# Patient Record
Sex: Female | Born: 1960 | Race: Black or African American | Hispanic: No | Marital: Single | Smoking: Current every day smoker
Health system: Southern US, Community
[De-identification: ages and names within clinical notes are randomized; demographics above are authoritative.]

## PROBLEM LIST (undated history)

## (undated) ENCOUNTER — Ambulatory Visit: Admission: EM | Payer: 59 | Source: Home / Self Care

## (undated) DIAGNOSIS — I10 Essential (primary) hypertension: Secondary | ICD-10-CM

## (undated) DIAGNOSIS — F17200 Nicotine dependence, unspecified, uncomplicated: Secondary | ICD-10-CM

## (undated) DIAGNOSIS — G5600 Carpal tunnel syndrome, unspecified upper limb: Secondary | ICD-10-CM

## (undated) DIAGNOSIS — F1910 Other psychoactive substance abuse, uncomplicated: Secondary | ICD-10-CM

## (undated) HISTORY — PX: ABDOMINAL HYSTERECTOMY: SHX81

## (undated) HISTORY — DX: Essential (primary) hypertension: I10

## (undated) HISTORY — DX: Carpal tunnel syndrome, unspecified upper limb: G56.00

## (undated) HISTORY — DX: Nicotine dependence, unspecified, uncomplicated: F17.200

## (undated) HISTORY — DX: Other psychoactive substance abuse, uncomplicated: F19.10

---

## 1999-07-06 ENCOUNTER — Emergency Department (HOSPITAL_COMMUNITY): Admission: EM | Admit: 1999-07-06 | Discharge: 1999-07-06 | Payer: Self-pay

## 2001-04-10 ENCOUNTER — Emergency Department (HOSPITAL_COMMUNITY): Admission: EM | Admit: 2001-04-10 | Discharge: 2001-04-10 | Payer: Self-pay | Admitting: Emergency Medicine

## 2001-04-10 ENCOUNTER — Encounter: Payer: Self-pay | Admitting: Emergency Medicine

## 2001-05-22 ENCOUNTER — Encounter: Admission: RE | Admit: 2001-05-22 | Discharge: 2001-05-22 | Payer: Self-pay | Admitting: Family Medicine

## 2002-08-26 ENCOUNTER — Emergency Department (HOSPITAL_COMMUNITY): Admission: EM | Admit: 2002-08-26 | Discharge: 2002-08-26 | Payer: Self-pay | Admitting: Emergency Medicine

## 2004-11-02 ENCOUNTER — Emergency Department (HOSPITAL_COMMUNITY): Admission: EM | Admit: 2004-11-02 | Discharge: 2004-11-02 | Payer: Self-pay | Admitting: Emergency Medicine

## 2004-11-10 ENCOUNTER — Ambulatory Visit: Admission: RE | Admit: 2004-11-10 | Discharge: 2004-11-10 | Payer: Self-pay | Admitting: Gynecologic Oncology

## 2004-11-10 ENCOUNTER — Other Ambulatory Visit: Admission: RE | Admit: 2004-11-10 | Discharge: 2004-11-10 | Payer: Self-pay | Admitting: Gynecologic Oncology

## 2004-11-10 ENCOUNTER — Encounter (INDEPENDENT_AMBULATORY_CARE_PROVIDER_SITE_OTHER): Payer: Self-pay | Admitting: *Deleted

## 2004-11-17 ENCOUNTER — Encounter (INDEPENDENT_AMBULATORY_CARE_PROVIDER_SITE_OTHER): Payer: Self-pay | Admitting: *Deleted

## 2004-11-17 ENCOUNTER — Inpatient Hospital Stay (HOSPITAL_COMMUNITY): Admission: RE | Admit: 2004-11-17 | Discharge: 2004-11-20 | Payer: Self-pay | Admitting: Obstetrics & Gynecology

## 2004-11-23 ENCOUNTER — Encounter: Admission: RE | Admit: 2004-11-23 | Discharge: 2004-11-23 | Payer: Self-pay | Admitting: Gynecologic Oncology

## 2004-12-01 ENCOUNTER — Ambulatory Visit (HOSPITAL_COMMUNITY): Admission: RE | Admit: 2004-12-01 | Discharge: 2004-12-01 | Payer: Self-pay | Admitting: Gynecology

## 2004-12-29 ENCOUNTER — Ambulatory Visit: Admission: RE | Admit: 2004-12-29 | Discharge: 2004-12-29 | Payer: Self-pay | Admitting: Gynecologic Oncology

## 2005-01-22 ENCOUNTER — Ambulatory Visit: Payer: Self-pay | Admitting: Family Medicine

## 2005-02-22 ENCOUNTER — Ambulatory Visit: Payer: Self-pay | Admitting: Sports Medicine

## 2005-12-06 IMAGING — CR DG CHEST 2V
2 series · 2 of 2 positions shown · non-contrast
Comparison: None.

CLINICAL DATA: Pelvic mass.  Preop respiratory exam.  
 CHEST - 2 VIEW:

[view not recorded (1 of 2)]
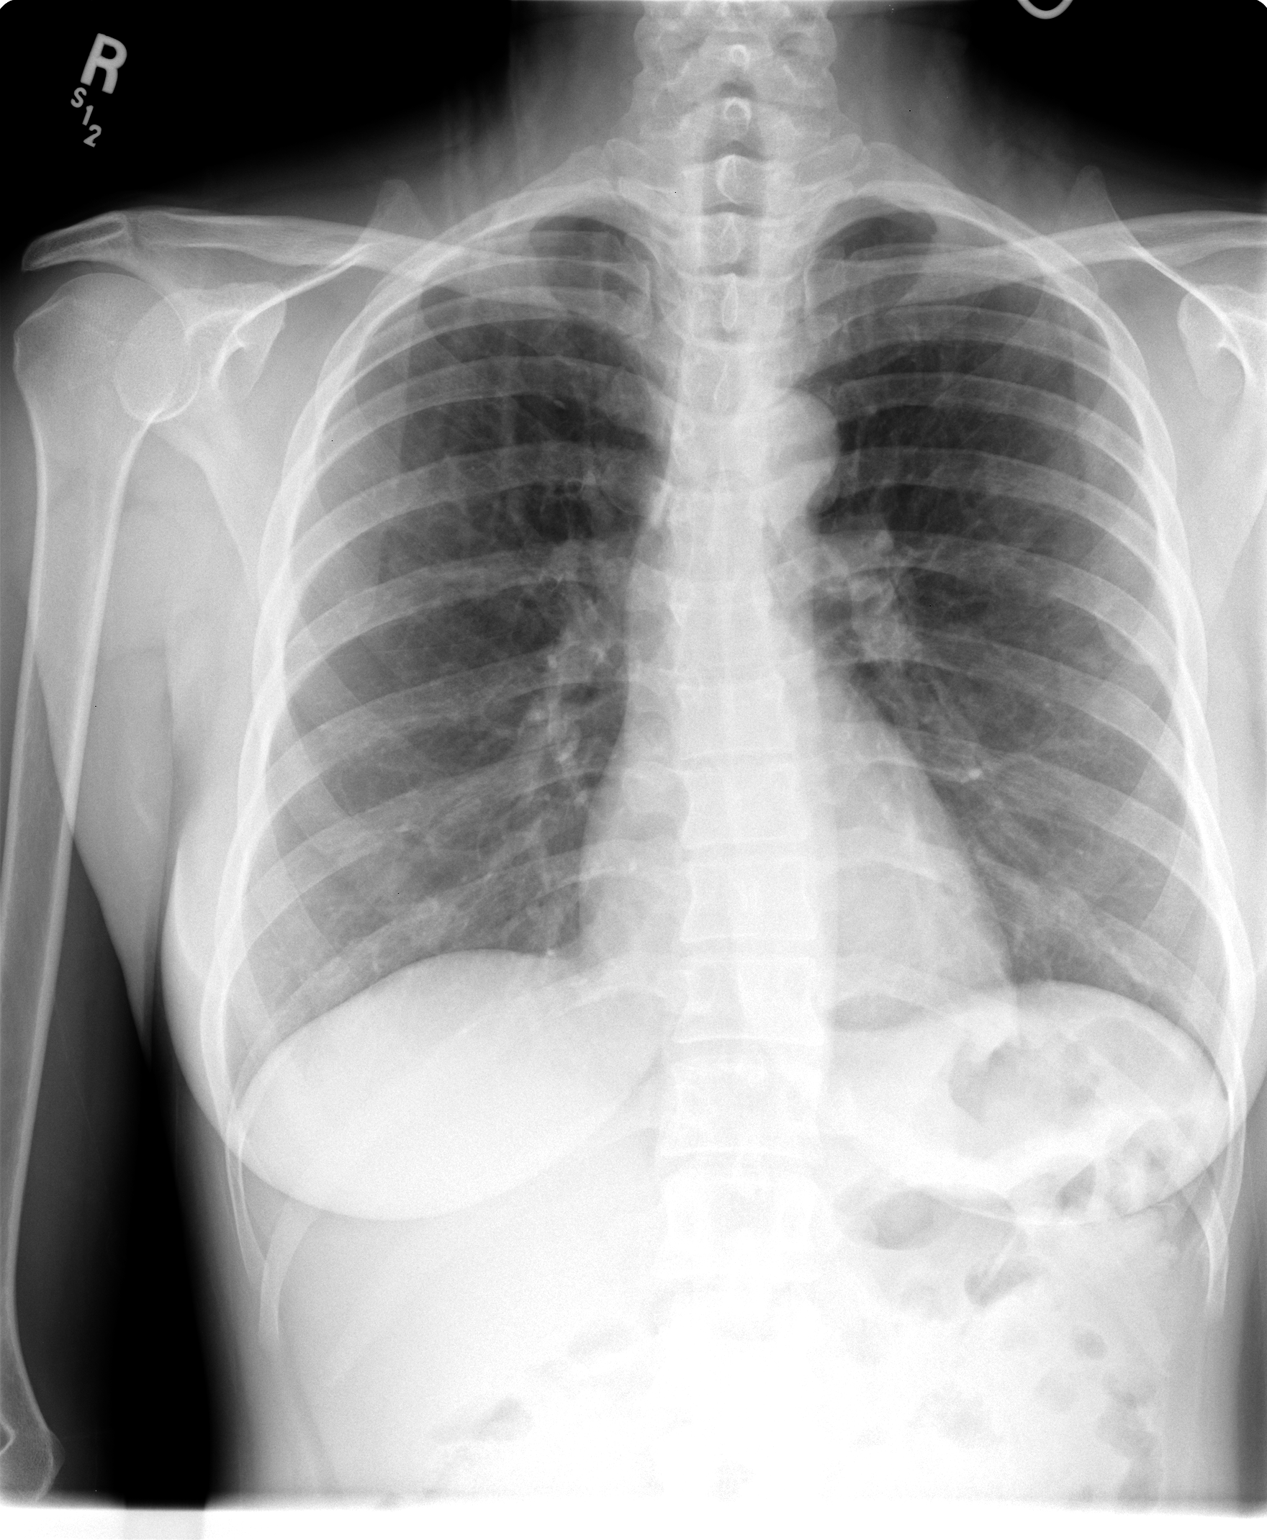

[view not recorded (2 of 2)]
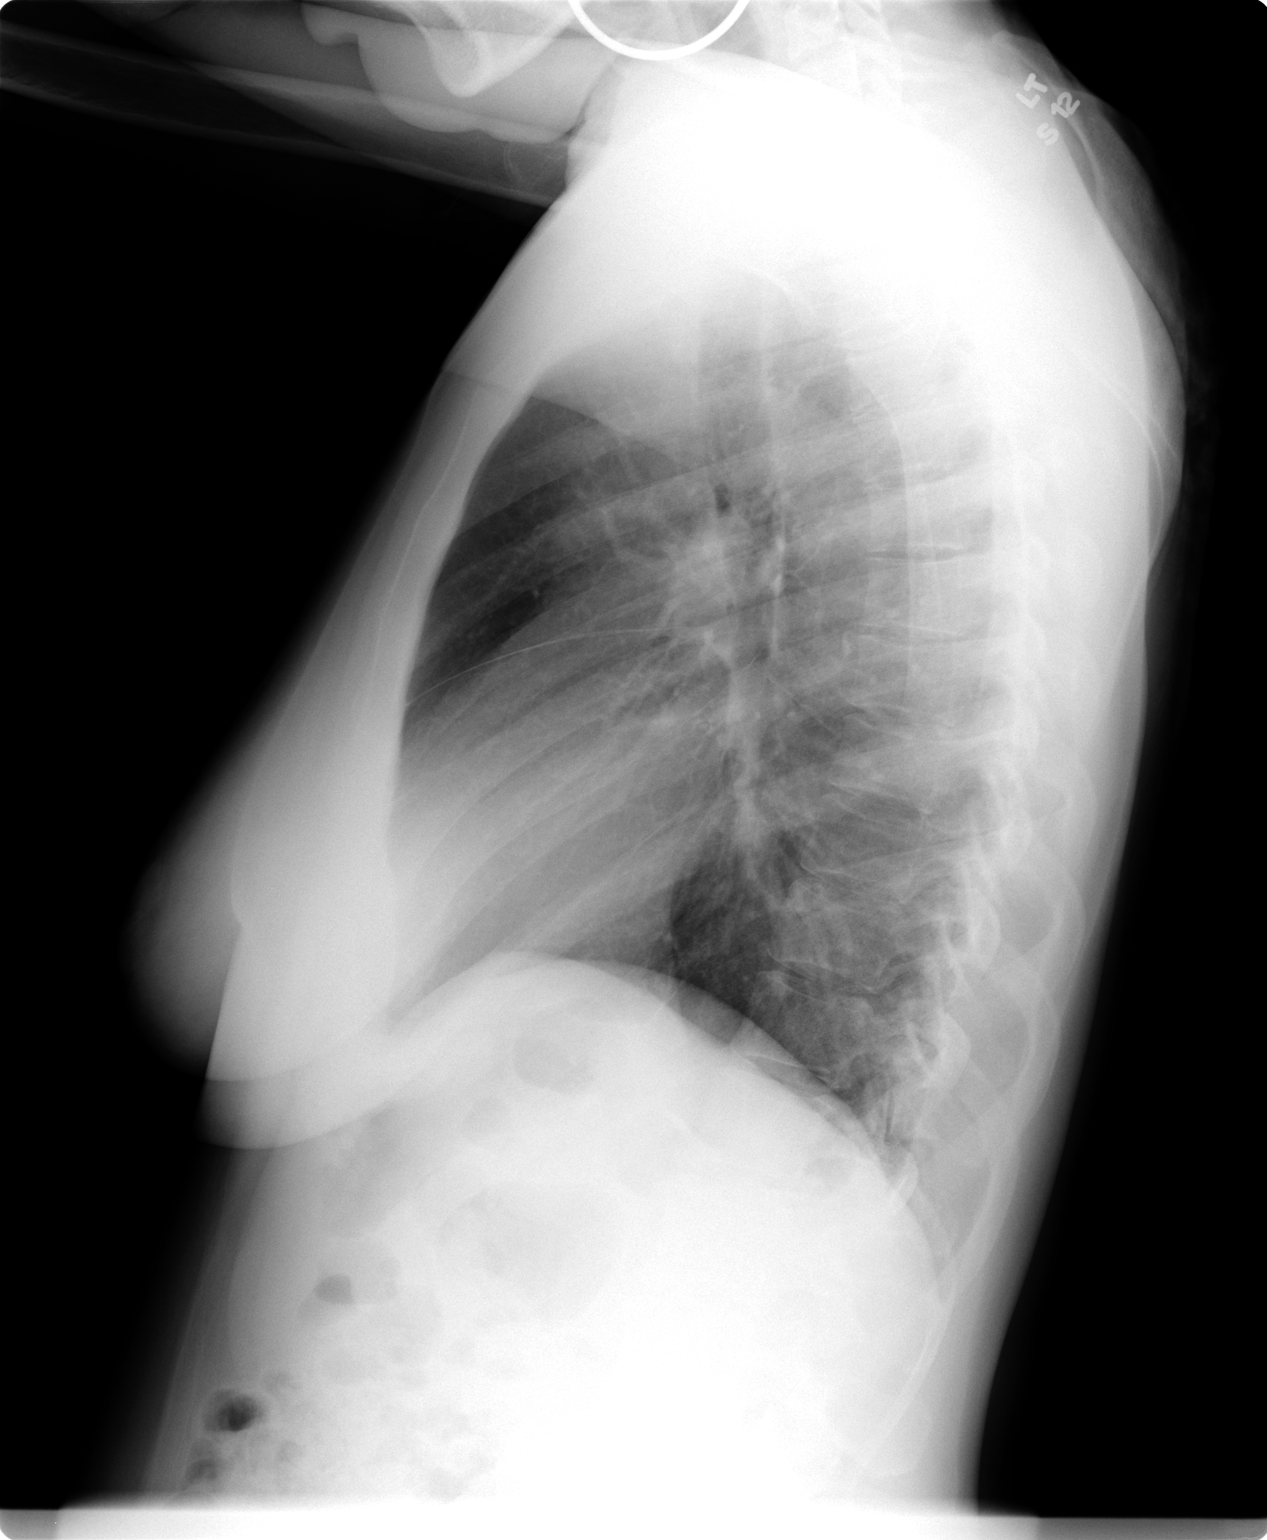

[2 of 2 positions shown; findings below may reference images not displayed]

Heart size and mediastinal contours are normal.  There is no evidence of pulmonary infiltrate or pleural effusion.
 A subtle asymmetric nodular opacity is seen in the left mid lung at the level of the posterior seventh intercostal space.  This measures approximately 1 cm, and a pulmonary nodule cannot be excluded.  
 No other nodules or masses are seen and there is no evidence of pleural effusion.  There is no evidence of pulmonary infiltrate.
IMPRESSION: 1.  No acute infiltrates.
 2.  Asymmetric pulmonary nodular opacity in the left mid lung.  Chest CT is recommended for further evaluation.

## 2005-12-11 IMAGING — CR DG CHEST 2V
2 series · 2 of 2 positions shown · non-contrast
Comparison: 11/13/04.

CLINICAL DATA: Pelvic mass with hysterectomy two days ago.  Weakness, fever and dyspnea.
 CHEST - 2 VIEW:

[w chest pa]
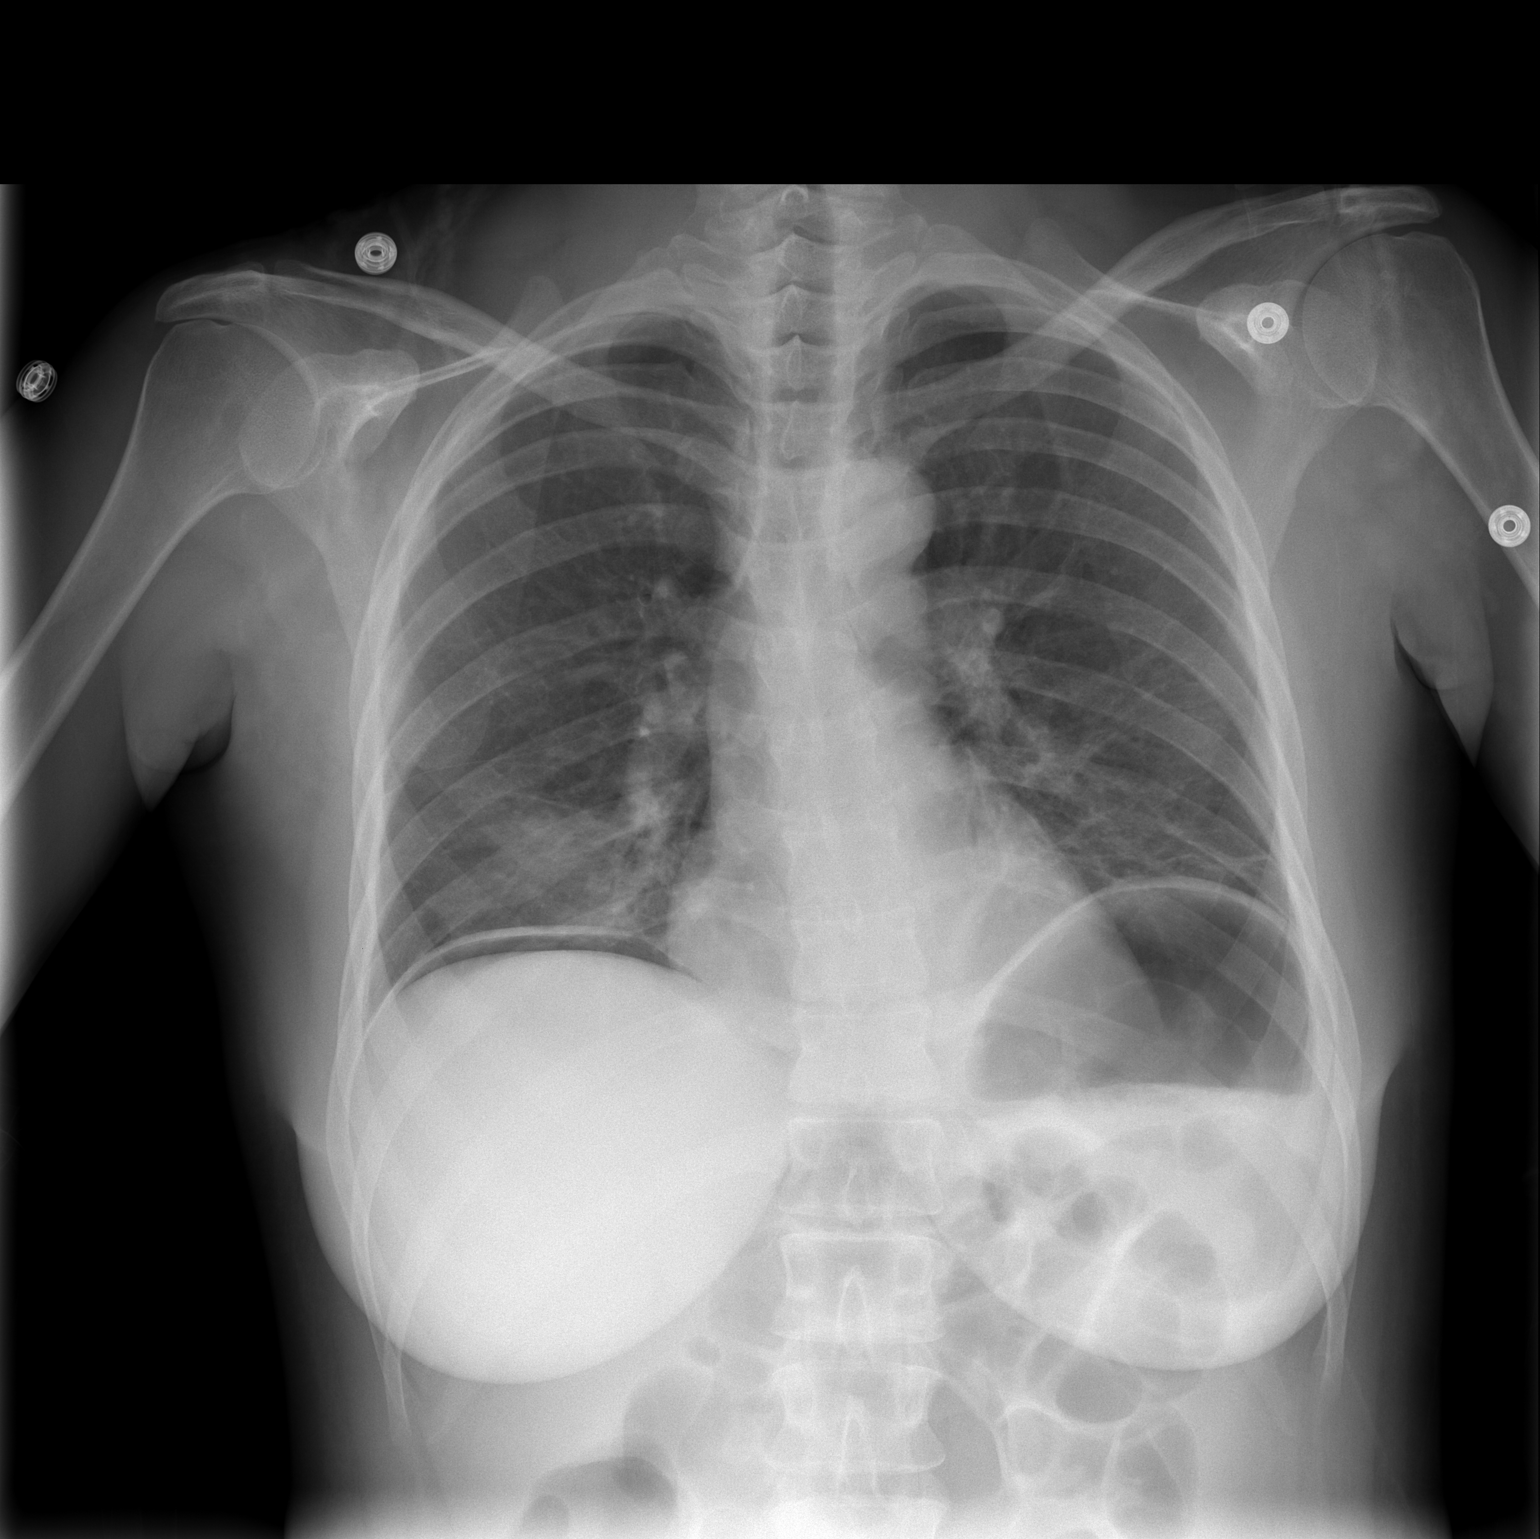

[w chest lat]
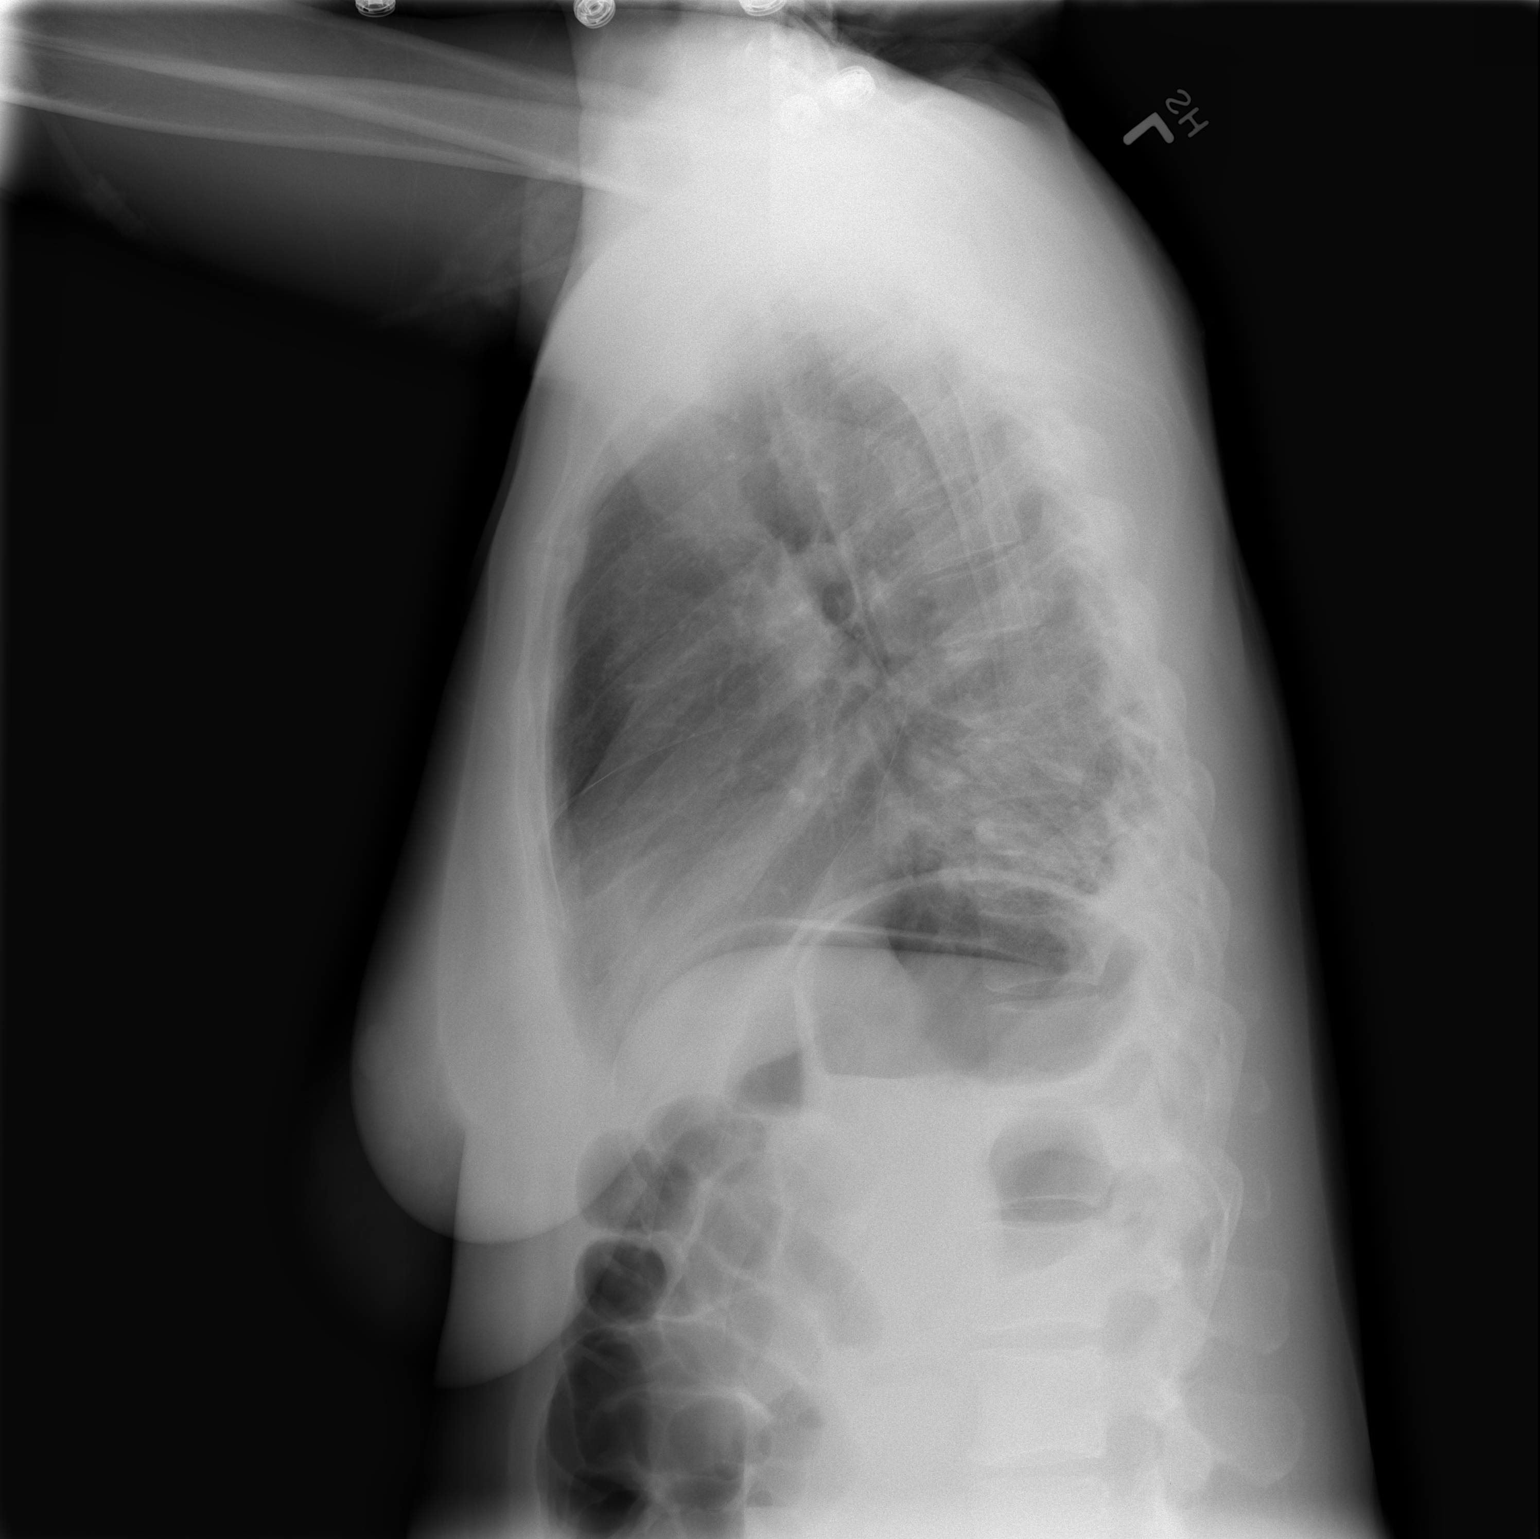

[2 of 2 positions shown; findings below may reference images not displayed]

New bilateral lower lung atelectasis /air space disease noted.  Pneumonia not excluded.  Small amount of pneumoperitoneum is likely postoperative; correlate clinically.  There may be mild ileus in the upper abdomen.
IMPRESSION: 1.  Bilateral lower lung atelectasis/air space disease.  
 2.  Pneumoperitoneum, likely postoperative, but correlate clinically.
 3.  Mild ileus.

## 2011-01-22 NOTE — Consult Note (Signed)
NAMELIEN, LYMAN           ACCOUNT NO.:  [id number]   MEDICAL RECORD NO.:  [id number]          PATIENT TYPE:  OUT   LOCATION:  GYN                          FACILITY:  [facility]   PHYSICIAN:  Paola A. Duard Brady, MD    DATE OF BIRTH:  [birth year]   DATE OF CONSULTATION:  12/29/2004  DATE OF DISCHARGE:                                   CONSULTATION   REASON FOR CONSULTATION:  Ms. Heavrin is a very pleasant 50 year old who  is referred to [place] secondary to a large abdominal pelvic mass. Tumor markers  were unremarkable. She subsequently on November 17, 2004 underwent exploratory  laparotomy, TAH/BSO. 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  There was no significant mitosis, no cellular atypia, no necrosis. Tubes and  ovaries were negative. As part of her preoperative workup, she also had  screening mammogram that showed scattered subcentimeter nonpalpable cysts,  most likely fibroadenomas. She had ultrasound and diagnosed mammography  performed November 23, 2004 and recommendations were for 18-month follow-up with  breast imaging consisting of a breast ultrasound. She also had a CT scan of  the chest in March secondary to left upper lobe nodular density noticed on  presurgical screening chest x-ray and it revealed no evidence of a left  upper lobe nodule that corresponded with the chest x-ray abnormality. It did  reveal a 2-3 mm nodule on the right middle lobe that appeared benign and no  further workup was recommended. She comes in today for her postoperative  follow-up. She is overall doing quite well and denies any complaints  whatsoever. She states her energy level is good. She is sleeping a lot and  eating a lot, but that is because she states she is bored. She denies any  vaginal bleeding or discharge. She has had normal return of bowel and  bladder function. She has minimal abdominal pain, only experiences a small  amount of discomfort when she wore jeans that were a little  tight along the  length of the incision. She otherwise feels that she will be ready to go  back to work next week.   PHYSICAL EXAMINATION:  VITAL SIGNS:  Blood pressure 138/92, weight 125  pounds.  GENERAL:  Well-nourished, well-developed female in no acute distress.  ABDOMEN:  Shows a well-healed vertical skin incision. Sutures from the #1  PDS are palpable. Abdomen is soft and nontender. There is no evidence of an  incisional hernia.  PELVIC:  External genitalia is within normal limits. The vagina is normal.  The vaginal cuff is healing well, has not completely healed, sutures are  still visible. Bimanual examination:  The vaginal cuff is nontender. There  is no fullness or nodularity palpated.   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   PLAN:  1.  She is released to work effective Jan 06, 2005. She will contact [employer] which is the insurance company for her employer and let      them know that  she will return back to work.  2.  I have asked her to wait another 2 weeks with regard to pelvic activity      and intercourse.  3.  She knows that this was not a malignant process. She will return to see      [place] on a p.r.n. basis but we will release her from our service.  4.  She will take the initiative herself to get set up for follow-up breast      imaging in September.  5.  She would like a primary care physician and would like to be seen at      [facility]. She has been made an appointment for Jan 05, 2005.      PAG/MEDQ  D:  12/29/2004  T:  12/29/2004  Job:  [id number]   cc:   Excell Seltzer. Annabell Howells, M.D.  [address]  [place]  [place] [address]  Fax: [phone]   Telford Nab, R.N.  [phone]  [place], [place] [address]   Roseanna Rainbow, M.D.

## 2011-01-22 NOTE — Consult Note (Signed)
Lauren Lester, Lauren Lester           ACCOUNT NO.:  [id number]   MEDICAL RECORD NO.:  [id number]          PATIENT TYPE:  OUT   LOCATION:  GYN                          [facility]ACILITY:  [facility]   PHYSICIAN:  Paola A. Duard Brady, MD    DATE OF BIRTH:  04/08/1961   DATE OF CONSULTATION:  11/10/2004  DATE OF DISCHARGE:                                   CONSULTATION   REFERRING PHYSICIAN:  Excell Seltzer. Annabell Howells, M.D.   Patient is seen today at the request of Dr. Annabell Howells.   Lauren Lester is a very pleasant 50 year old gravida 1, para 1, who was  evaluated acutely in the emergency room on November 04, 2004 with urinary  retention.  She subsequently underwent a CT scan of the abdomen and pelvis  at that time, which revealed bilobular soft tissue mass with irregular  enhancement within the pelvis.  The inferior pelvic portion of the mass  measured 12 x 12 cm, and there was a lobular solid component that extended  superiorly into the right, measuring 10 x 5.6 cm.  There was lateral  deviation of the ureters and compression of the ureters at the pelvis brim.  There was a 2.8 cm cyst adjacent to the left iliac crest, which could be the  left ovary.  There was not any definitive right ovary noted.  It is also  felt that this most likely represented a fibroid uterus but could not rule  out malignancy.  There was no ascites or obvious lymphadenopathy.  This was  the reason that she was referred to [place] today.  She has had limited access to  GYN care, and this is the first that she has known of fibroids.  Her last  GYN exam was anywhere from 5 to 10 years ago.  She is having regular cycles.  Her last cycle was October 20, 2004.  She denies any irregular bleeding,  intramenstrual spotting, or postcoital bleeding.  She has noted an increase  in cramps over the past several years but is not really particularly  bothered.  She denies any symptoms consistent with pelvic pressure or change  in her bowel or bladder habits other than  this acute urinary retention.  She  currently has an indwelling Foley catheter.  She does use stool softeners,  but that has been a longstanding issue for her.  She is sexually active and  denies any postcoital bleeding or dyspareunia.   PAST MEDICAL HISTORY:  None.   PAST SURGICAL HISTORY:  Left leg surgery.  Normal spontaneous vaginal  delivery.   MEDICATIONS:  Stool softener.   ALLERGIES:  None.   SOCIAL HISTORY:  She smokes a half pack per day.  She has done so for 10  years.  She drinks alcohol socially.  She denies the use of any recreational  drugs.  She is married.  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   FAMILY HISTORY:  Her father died of prostate cancer last year.  Her mother  has hypertension.   HEALTH MAINTENANCE:  She has never had a mammogram.   PHYSICAL EXAMINATION:  VITAL SIGNS:  Height 5 foot 8.  Weight 128 pounds.  Blood pressure 148/103.  Repeat 140/90.  Pulse 92.  Respirations 18.  GENERAL:  A well-developed and well-nourished female in no acute distress.  NECK:  Supple.  There is no lymphadenopathy.  No thyromegaly.  LUNGS:  Clear to auscultation bilaterally.  CARDIOVASCULAR:  Regular rate and rhythm.  BREASTS:  Symmetrical.  On the right breast, there is a 1 x 1.5 cm ridge of  tissue, which most likely represents fibrocystic changes.  There is no  overlying skin changes, and it is nontender.  The patient is allowed to  palpate the mass today and felt that she has not noticed that before.  There  is no nipple discharge or axillary adenopathy.  The right breast has no  masses or nodularity, or nipple discharge or axillary adenopathy.  ABDOMEN:  She has a palpable abdominal pelvic mass below the level of the  umbilicus.  Abdomen is soft, nontender and nondistended.  There are no  palpable masses or hepatosplenomegaly.  Groins are negative for adenopathy.  EXTREMITIES:  There is no edema.  PELVIC:  External genitalia is within normal limits.  She  does have an  indwelling Foley catheter.  The vagina is well epithelialized.  The cervix  is somewhat flattened and deviated anteriorly.  There is physiologic  discharge.  There are no visible lesions.  ThinPrep Pap smear was  assimilated without difficulty.  Bimanual examination reveals a large  abdominopelvic mass measuring approximately 15 cm.  I cannot separate the  adnexa particularly.  Rectovaginal examination confirms the mass is mobile.  One can elevate it out of the pelvis.  The rectovaginal mucosa is smooth.   ASSESSMENT:  A 50 year old with urinary retention, most likely secondary to  massive uterine fibroids; however, malignancy cannot be excluded.  She is  tentatively scheduled for surgery for November 17, 2004 with myself and Dr.  Antionette Char.  The patient wishes to have a total abdominal  hysterectomy, bilateral salpingo-oophorectomy.  She does know there is a  possibility of malignancy.  The risks of surgery, including but not limited  to, bleeding, infection, injury to surrounding organs, ureteral injury,  bowel injury, nerve damage, and venothrombolic disease are discussed with  the patient.  She is anemic with a hematocrit of 28.  The possibility of a  blood transfusion was also discussed with her, and she agreed and will  proceed with one should the need arise.   Her questions were answered.  She knows she will be contacted with regards  to her preoperative workup and was given our cards to call me should she  have any questions.      PAG/MEDQ  D:  11/10/2004  T:  11/10/2004  Job:  [id number]   cc:   Telford Nab, R.N.  [address]  [place], [place] [address]   Excell Seltzer. Annabell Howells, M.D.  [address]  [place]  [place] [address]  Fax: [phone]   Roseanna Rainbow, M.D.

## 2011-01-22 NOTE — Op Note (Signed)
Lauren Lester, Lauren Lester           ACCOUNT NO.:  [id number]   MEDICAL RECORD NO.:  [id number]          PATIENT TYPE:  INP   LOCATION:  0008                         FACILITY:  [facility]   PHYSICIAN:  Paola A. Duard Brady, MD    DATE OF BIRTH:  1961/06/19   DATE OF PROCEDURE:  11/17/2004  DATE OF DISCHARGE:                                 OPERATIVE REPORT   PREOPERATIVE DIAGNOSES:  1.  Probable uterine fibroids.  2.  Urinary retention.   POSTOPERATIVE DIAGNOSES:  1.  Probable uterine fibroids.  2.  Urinary retention.   PROCEDURE:  Exploratory laparotomy, TAH, BSO.   SURGEONS:  1.  Dr. Duard Brady.  2.  Dr. Antionette Char.   ASSISTANT:  Telford Nab, R.N.   ANESTHESIA:  General.   SPECIMENS:  Cervix, uterus, fibroids, tubes, and ovaries.   ESTIMATED BLOOD LOSS:  975 mL.   URINE OUTPUT:  75 mL IV fluid, 4000 mL.   FINDINGS:  Findings at time of surgery included a uterus with multiple small  uterine fibroids measuring approximately 8 cm.  Then off the inferior-  posterior aspect of the uterus near the cervix, there was a large 16 cm  uterine myoma encompassing the entire posterior cul-de-sac.  Tubes and  ovaries were unremarkable.  She had a small 2 cm left ovarian cyst.   The patient was taken to the operating room after informed consent was  reviewed.  She was placed in the supine position, and general anesthesia was  induced.  She was then placed in the dorsal lithotomy position with all  appropriate precautions being taken.  Time out was performed.  Abdomen was  prepped and draped in the usual sterile fashion.  Perineum was prepped in  the usual fashion.  Her prior Foley catheter was removed, and a sterile  Foley was placed under sterile conditions.  A vertical infraumbilical  midline incision was made with a knife and carried down to the fascia  sharply.  The fascia was identified, and the fascia was opened and scored in  the midline and opened inferior and superiorly with  Bovie cautery.  The  peritoneum was identified, tented, and entered sharply.  Peritoneal incision  was extended using Bovie cautery, with front visualization of just the  underlying peritoneal cavity.  At this point, the findings of the above were  noted.  Our attention was first drawn to the round ligament on the patient's  left side.  It was cauterized, and the anterior and posterior leafs of the  broad ligament were opened.  The pelvic vessels were identified; the ureter  was palpated, and a window was created in the peritoneum.  The IP was  clamped x 2, transected, and ligated with 0 Vicryl.  Our attention was then  drawn to the anterior leaf of the broad ligament, and the bladder flap was  created.  There were significant dense adhesions of the bladder to the  anterior portion of the cervix and this large myoma.  There was  some__________on the patient's right side.  The round ligament was  transected, and the anterior and posterior leafs of the  broad ligament were  opened using Bovie cautery.  The ureter was identified, and a window was  created the infundibulopelvic vessels and the ureter.  The IP was clamped  three times, transected, and ligated with 0 Vicryl.  An attempt was made to  mobilize and deliver the mass out of the cul-de-sac; however, it could not  be elevated.  At this time, there was bleeding noted from the uterine artery  on the patient's right side.  We skeletonized the uterine artery at the  level of the uterus isthmic junction; however, this large mass was  distorting the anatomy significantly.  It was clamped with curved Heaneys,  transected, and suture ligated.  There continued to be a small amount of  backbleeding.  The bladder flap was then created sharply.  The posterior  peritoneum was opened with Bovie cautery.  This was carried from the  patient's right side around to the patient's left side.  The uterine cautery  was identified on her left side, clamped x  2, transected, and ligated.  There was no significant bleeding from any one source but more of a diffuse  ooze of all the peritoneal surfaces at this point.  The decision was made to  continue mobilizing the peritoneum posteriorly which we did.  The bladder  flap was created __________was noted to be well below the level of the  cervix.  Again, there were dense adhesions of the bladder to the anterior  cervix, and bladder flap was created using sharp dissection with Bovie  cautery for hemostasis as indicated.  We had not been able to visually see  the ureters at this point.  The decision was then made to open the uterus  and deliver the myoma out of the cul-de-sac to allow with visualization.   The uterus was scored.  The myoma was grasped and delivered out of the  pelvis.  The uterine remnant was then grasped with Allis clamps.  At this  point, we were able to visualize the patient's pelvic sidewall on the left  side better as the myoma was delivered out of the pelvis.  We were able to  bring down and dissect the uterine artery free of the uterine serosa and at  a point at the level of the cervix, we double clamped the uterine artery,  transected it, and suture ligated it with 0 Vicryl.  Straight Heaney clamps  were then used down the cardinal ligaments to clamp the uterosacral.  It was  clamped, cut with a knife, and suture ligated with 0 Vicryl.  At this point,  we were at the cervicovaginal angle on the patient's left side and, again,  it was clamped with Heaney, transected, and suture ligated with a figure-of-  eight suture of 0 Vicryl.  We then proceeded to perform a similar procedure  on the patient's right side.  We were able to take the uterine artery lower  in the pelvis.  After delivering the myoma, the uterine artery was doubly  clamped, transected, and  suture ligated.  It was difficult to determine the level of the cervicovaginal angle on the patient's right side.   Therefore,  the surgeon's hand was placed in the vagina, and Bovie cautery was used on  the surgeon's finger around the cervix to amputate the specimen.  The  vaginal mucosa was then grasped with Allis clamps.  The specimen was  delivered and sent to pathology for frozen section.   The vaginal cuff was closed  using figure-of-eight sutures of 0 Vicryl.  After this procedure was completed, there was noted to be a small area of  bleeding at the level of the uterine artery on the patient's right side.  The ureter was identified along the medial leaf of the broad ligament.  It  was noted to be nondilated and peristalsing.  A hemoclip was placed lateral  to the ureter at the level of the uterine artery where there was a small  line of bleeding.  The peritoneal edges also bleeding somewhat.  Cautery was  used for hemostasis.   Our attention was then turned to the patient's left pelvic sidewall.  The  retroperitoneum was opened in its entirety.  The ureter was identified.  It  was noted to be nondilated.  There was a small amount of bleeding of the  vaginal cuff.  This was remedied with a 2-0 Vicryl figure-of-eight suture  involving the posterior peritoneum on the patient's left side.  The abdomen  and pelvis were copiously irrigated.  Gelfoam was then placed in the pelvis.  A laparotomy sponge was removed from the abdomen.  The omentum was palpably  normal.  There was no lymphadenopathy  palpated.  The Bookwalter retractor was then removed.  The fascia was closed  using a running #1 PDS.  The subcu tissues were irrigated, and the skin was  closed using skin clips.  The patient tolerated the procedure well and was  taken to the recovery room in stable condition.  All instrument and  laparotomy counts were correct x 2.      PAG/MEDQ  D:  11/17/2004  T:  11/17/2004  Job:  [id number]   cc:   Roseanna Rainbow, M.D.   Telford Nab, R.N.  [address]  [place], [place] [address]   Excell Seltzer. Annabell Howells, M.D.  [address]  [place]  [place] [address]  Fax: [phone]

## 2011-01-22 NOTE — Discharge Summary (Signed)
NAMEWILLIEMAE, Lauren Lester           ACCOUNT NO.:  [id number]   MEDICAL RECORD NO.:  [id number]          PATIENT TYPE:  INP   LOCATION:  0442                         FACILITY[facility]  [facility]   PHYSICIAN:  Roseanna Rainbow, M.D.DATE OF BIRTH:  Aug 29, 1961   DATE OF ADMISSION:  11/17/2004  DATE OF DISCHARGE:  11/20/2004                                 DISCHARGE SUMMARY   CHIEF COMPLAINT:  The patient is a 50 year old para 1 with large uterine  fibroids who presents for surgical intervention. Please see the dictated  History and Physical as per Dr. Cleda Mccreedy for further details.   HOSPITAL COURSE:  The patient was admitted and underwent an exploratory  laparotomy with total abdominal hysterectomy and bilateral salpingo-  oophorectomy. Again, please see the dictated operative summary for further  details. Her postoperative course was remarkable for a febrile episode with  fever to 101 within the first 24 hours. Chest x-ray demonstrated  atelectasis. Urinalysis was remarkable for pyuria and she was started on  antibiotics. She was discharged to home on postoperative day #4 tolerating a  regular diet.   DISCHARGE DIAGNOSES:  1.  Uterine fibroids.  2.  Urinary tract infection.   PROCEDURE:  Total abdominal hysterectomy and bilateral salpingo-  oophorectomy.   MEDICATIONS:  Vicodin, Levaquin, and ibuprofen.   ACTIVITY:  Progressive activity. No lifting or straining for 6 weeks. Pelvic  rest.   DISPOSITION:  The patient was to follow up at the breast center for a  mammogram on November 23, 2004 and to follow up in the GYN oncology on March 20  for staple removal.      LAJ/MEDQ  D:  12/15/2004  T:  12/15/2004  Job:  [id number]   cc:   Telford Nab, R.N.  [address]  [place], [place] [address]   Excell Seltzer. Annabell Howells, M.D.  [address]  [place]  [place] [address]  Fax: [phone]

## 2012-12-02 ENCOUNTER — Ambulatory Visit (INDEPENDENT_AMBULATORY_CARE_PROVIDER_SITE_OTHER): Payer: Managed Care, Other (non HMO) | Admitting: Family Medicine

## 2012-12-02 VITALS — BP 142/90 | HR 87 | Temp 98.3°F | Resp 18 | Ht 65.0 in | Wt 147.0 lb

## 2012-12-02 DIAGNOSIS — B029 Zoster without complications: Secondary | ICD-10-CM

## 2012-12-02 DIAGNOSIS — F1021 Alcohol dependence, in remission: Secondary | ICD-10-CM | POA: Insufficient documentation

## 2012-12-02 MED ORDER — VALACYCLOVIR HCL 1 G PO TABS: 1000.0000 mg | ORAL_TABLET | Freq: Three times a day (TID) | ORAL | Status: DC

## 2012-12-02 MED ORDER — PREDNISONE 20 MG PO TABS: ORAL_TABLET | ORAL | Status: DC

## 2012-12-02 MED ORDER — GABAPENTIN 300 MG PO CAPS: 300.0000 mg | ORAL_CAPSULE | Freq: Three times a day (TID) | ORAL | Status: DC

## 2012-12-02 NOTE — Progress Notes (Signed)
[address]   [place], [place]  [address]   [phone]  Subjective:    Patient ID: Lauren Lester, female    DOB: 1961/09/06, 52 y.o.   MRN: [id number]  HPI This 52 y.o. female presents for evaluation of R sided torso rash.  Onset four days ago; started with R upper back rash; has extended to R breast.  Working eleven hours per shift; lots of work stressors.  +chicken pox.  No fever but mild HA.  Taking Ibuprofen with relief.  +itching and burning.  Really sore; throbs.  Intermittent numbness and tingling.   Husband with CHF with recent defibrillator placement.  [facility]P:  [facility]   Review of Systems  Constitutional: Negative for fever, chills, diaphoresis, activity change, appetite change and fatigue.  Skin: Positive for color change and rash. Negative for wound.  Neurological: Positive for headaches. Negative for light-headedness and numbness.        Past Medical History  Diagnosis Date  . Substance abuse     Past Surgical History  Procedure Laterality Date  . Abdominal hysterectomy      Prior to Admission medications   Medication Sig Start Date End Date Taking? Authorizing Provider  gabapentin (NEURONTIN) 300 MG capsule Take 1 capsule (300 mg total) by mouth 3 (three) times daily. 12/02/12   Ethelda Chick, MD  predniSONE (DELTASONE) 20 MG tablet Three tablets daily x 1 day then two tablets daily x 5 days then one tablet daily x 5 days 12/02/12   Ethelda Chick, MD  valACYclovir (VALTREX) 1000 MG tablet Take 1 tablet (1,000 mg total) by mouth 3 (three) times daily. 12/02/12   Ethelda Chick, MD    No Known Allergies  History   Social History  . Marital Status: Single    Spouse Name: N/A    Number of Children: N/A  . Years of Education: N/A   Occupational History  . Not on file.   Social History Main Topics  . Smoking status: Current Every Day Smoker  . Smokeless tobacco: Not on file  . Alcohol Use: No  . Drug Use: No  . Sexually Active: Yes     Other Topics Concern  . Not on file   Social History Narrative   Marital status: married      Children: one child; no grandchild.      Lives: with husband.      Employment: Location manager.      Tobacco: 1 ppd      Alcohol: none; previous alcoholism; cessation since 2013.      Drugs:  none    Family History  Problem Relation Age of Onset  . Hypertension Mother     Objective:   Physical Exam  Nursing note and vitals reviewed. Constitutional: She is oriented to person, place, and time. She appears well-developed and well-nourished. No distress.  Cardiovascular: Normal rate, regular rhythm and normal heart sounds.   No murmur heard. Pulmonary/Chest: Effort normal and breath sounds normal. She has no wheezes. She has no rales.  Neurological: She is alert and oriented to person, place, and time.  Skin: Skin is warm and dry. Rash noted. She is not diaphoretic.     Maculopapular rash R thoracic region radiating into R axilla and R breast dermatomal distribution.  Psychiatric: She has a normal mood and affect. Her behavior is normal.        Assessment & Plan:  Herpes zoster  Alcoholism in remission   1.  Herpes Zoster R thoracic region:  New.  Rx for Valtrex, Prednisone, Neurontin provided.  Local wound care.   2.  Alcoholism in remission : Stable; avoid narcotics for pain control.

## 2012-12-02 NOTE — Patient Instructions (Addendum)
Herpes zoster  Shingles Shingles is caused by the same virus that causes chickenpox (varicella zoster virus or VZV). Shingles often occurs many years or decades after having chickenpox. That is why it is more common in adults older than 50 years. The virus reactivates and breaks out as an infection in a nerve root. SYMPTOMS   The initial feeling (sensations) may be pain. This pain is usually described as:  Burning.  Stabbing.  Throbbing.  Tingling in the nerve root.  A red rash will follow in a couple days. The rash may occur in any area of the body and is usually on one side (unilateral) of the body in a band or belt-like pattern. The rash usually starts out as very small blisters (vesicles). They will dry up after 7 to 10 days. This is not usually a significant problem except for the pain it causes.  Long-lasting (chronic) pain is more likely in an elderly person. It can last months to years. This condition is called postherpetic neuralgia. Shingles can be an extremely severe infection in someone with AIDS, a weakened immune system, or with forms of leukemia. It can also be severe if you are taking transplant medicines or other medicines that weaken the immune system. TREATMENT  Your caregiver will often treat you with:  Antiviral drugs.  Anti-inflammatory drugs.  Pain medicines. Bed rest is very important in preventing the pain associated with herpes zoster (postherpetic neuralgia). Application of heat in the form of a hot water bottle or electric heating pad or gentle pressure with the hand is recommended to help with the pain or discomfort. PREVENTION  A varicella zoster vaccine is available to help protect against the virus. The Food and Drug Administration approved the varicella zoster vaccine for individuals 50 years of age and older. HOME CARE INSTRUCTIONS   Cool compresses to the area of rash may be helpful.  Only take over-the-counter or prescription medicines for pain,  discomfort, or fever as directed by your caregiver.  Avoid contact with:  Babies.  Pregnant women.  Children with eczema.  Elderly people with transplants.  People with chronic illnesses, such as leukemia and AIDS.  If the area involved is on your face, you may receive a referral for follow-up to a specialist. It is very important to keep all follow-up appointments. This will help avoid eye complications, chronic pain, or disability. SEEK IMMEDIATE MEDICAL CARE IF:   You develop any pain (headache) in the area of the face or eye. This must be followed carefully by your caregiver or ophthalmologist. An infection in part of your eye (cornea) can be very serious. It could lead to blindness.  You do not have pain relief from prescribed medicines.  Your redness or swelling spreads.  The area involved becomes very swollen and painful.  You have a fever.  You notice any red or painful lines extending away from the affected area toward your heart (lymphangitis).  Your condition is worsening or has changed. Document Released: 08/23/2005 Document Revised: 11/15/2011 Document Reviewed: 07/28/2009 ExitCare Patient Information 2013 [employer].  

## 2012-12-14 ENCOUNTER — Other Ambulatory Visit: Payer: Self-pay | Admitting: Family Medicine

## 2012-12-15 NOTE — Telephone Encounter (Signed)
Called and LMOM on cell # to CB w/status and why she is requesting RFs of meds for shingles.

## 2015-09-20 ENCOUNTER — Ambulatory Visit (INDEPENDENT_AMBULATORY_CARE_PROVIDER_SITE_OTHER): Payer: Managed Care, Other (non HMO) | Admitting: Emergency Medicine

## 2015-09-20 VITALS — BP 142/68 | HR 104 | Temp 97.7°F | Resp 16 | Ht 66.0 in | Wt 158.0 lb

## 2015-09-20 DIAGNOSIS — G5603 Carpal tunnel syndrome, bilateral upper limbs: Secondary | ICD-10-CM

## 2015-09-20 MED ORDER — NAPROXEN SODIUM 550 MG PO TABS: 550.0000 mg | ORAL_TABLET | Freq: Two times a day (BID) | ORAL | Status: AC

## 2015-09-20 NOTE — Progress Notes (Signed)
Subjective:  Patient ID: Lauren Lester, female    DOB: 1960/10/17  Age: 55 y.o. MRN: [id number]  CC: Tingling and Knot   HPI Lauren Lester presents  patient has a history of numbness and tingling in her hands. She says it wakes her up at night. Worse when she works as well. She has no history of injury or overuse. She types a lot and her job works in Production designer, theatre/television/film. She's developed some weakness that caused her to drop things at work  History Thea has a past medical history of Substance abuse.   She has past surgical history that includes Abdominal hysterectomy.   Her  family history includes Hypertension in her mother.  She   reports that she has been smoking.  She does not have any smokeless tobacco history on file. She reports that she does not drink alcohol or use illicit drugs.  Outpatient Prescriptions Prior to Visit  Medication Sig Dispense Refill  . gabapentin (NEURONTIN) 300 MG capsule Take 1 capsule (300 mg total) by mouth 3 (three) times daily. (Patient not taking: Reported on 09/20/2015) 60 capsule 0  . predniSONE (DELTASONE) 20 MG tablet Three tablets daily x 1 day then two tablets daily x 5 days then one tablet daily x 5 days (Patient not taking: Reported on 09/20/2015) 18 tablet 0  . valACYclovir (VALTREX) 1000 MG tablet Take 1 tablet (1,000 mg total) by mouth 3 (three) times daily. (Patient not taking: Reported on 09/20/2015) 30 tablet 0   No facility-administered medications prior to visit.    Social History   Social History  . Marital Status: Single    Spouse Name: N/A  . Number of Children: N/A  . Years of Education: N/A   Social History Main Topics  . Smoking status: Current Every Day Smoker  . Smokeless tobacco: None  . Alcohol Use: No  . Drug Use: No  . Sexual Activity: Yes   Other Topics Concern  . None   Social History Narrative   Marital status: married      Children: one child; no grandchild.      Lives: with husband.   Employment: Location manager.      Tobacco: 1 ppd      Alcohol: none; previous alcoholism; cessation since 2013.      Drugs:  none     Review of Systems  Constitutional: Negative for fever, chills and appetite change.  HENT: Negative for congestion, ear pain, postnasal drip, sinus pressure and sore throat.   Eyes: Negative for pain and redness.  Respiratory: Negative for cough, shortness of breath and wheezing.   Cardiovascular: Negative for leg swelling.  Gastrointestinal: Negative for nausea, vomiting, abdominal pain, diarrhea, constipation and blood in stool.  Endocrine: Negative for polyuria.  Genitourinary: Negative for dysuria, urgency, frequency and flank pain.  Musculoskeletal: Negative for gait problem.  Skin: Negative for rash.  Neurological: Positive for numbness. Negative for weakness and headaches.  Psychiatric/Behavioral: Negative for confusion and decreased concentration. The patient is not nervous/anxious.     Objective:  BP 142/68 mmHg  Pulse 104  Temp(Src) 97.7 F (36.5 C) (Oral)  Resp 16  Ht 5\' 6"  (1.676 m)  Wt 158 lb (71.668 kg)  BMI 25.51 kg/m2  SpO2 94%  Physical Exam  Constitutional: She is oriented to person, place, and time. She appears well-developed and well-nourished.  HENT:  Head: Normocephalic and atraumatic.  Eyes: Conjunctivae are normal. Pupils are equal, round, and reactive to light.  Pulmonary/Chest: Effort  normal.  Musculoskeletal: She exhibits no edema.  Neurological: She is alert and oriented to person, place, and time.  Skin: Skin is dry.  Psychiatric: She has a normal mood and affect. Her behavior is normal. Thought content normal.   Phalen and Tinel are both positive   Assessment & Plan:   Dois DavenportSandra was seen today for tingling and knot.  Diagnoses and all orders for this visit:  Bilateral carpal tunnel syndrome -     Ambulatory referral to Neurology  Other orders -     naproxen sodium (ANAPROX DS) 550 MG tablet; Take 1  tablet (550 mg total) by mouth 2 (two) times daily with a meal.   I am having Ms. Leger start on naproxen sodium. I am also having her maintain her valACYclovir, predniSONE, and gabapentin.  Meds ordered this encounter  Medications  . naproxen sodium (ANAPROX DS) 550 MG tablet    Sig: Take 1 tablet (550 mg total) by mouth 2 (two) times daily with a meal.    Dispense:  40 tablet    Refill:  0    Appropriate red flag conditions were discussed with the patient as well as actions that should be taken.  Patient expressed his understanding.  Follow-up: Return if symptoms worsen or fail to improve.  Carmelina DaneAnderson, Elcie Pelster S, MD

## 2015-09-20 NOTE — Patient Instructions (Signed)

## 2015-10-01 ENCOUNTER — Encounter: Payer: Self-pay | Admitting: Neurology

## 2015-10-01 ENCOUNTER — Ambulatory Visit: Payer: Managed Care, Other (non HMO) | Admitting: Neurology

## 2015-10-01 ENCOUNTER — Ambulatory Visit (INDEPENDENT_AMBULATORY_CARE_PROVIDER_SITE_OTHER): Payer: Managed Care, Other (non HMO) | Admitting: Neurology

## 2015-10-01 VITALS — BP 150/90 | HR 74 | Resp 16 | Ht 66.0 in | Wt 159.0 lb

## 2015-10-01 DIAGNOSIS — R202 Paresthesia of skin: Secondary | ICD-10-CM | POA: Diagnosis not present

## 2015-10-01 DIAGNOSIS — G5603 Carpal tunnel syndrome, bilateral upper limbs: Secondary | ICD-10-CM | POA: Diagnosis not present

## 2015-10-01 DIAGNOSIS — R292 Abnormal reflex: Secondary | ICD-10-CM | POA: Diagnosis not present

## 2015-10-01 DIAGNOSIS — R200 Anesthesia of skin: Secondary | ICD-10-CM

## 2015-10-01 MED ORDER — TRAMADOL HCL 50 MG PO TABS: 50.0000 mg | ORAL_TABLET | Freq: Three times a day (TID) | ORAL | Status: DC | PRN

## 2015-10-01 NOTE — Patient Instructions (Signed)
We will schedule you to have the following tests  Nerve conduction study MRI of the cervical spine   You can take tramadol 3 times a day to help with the pain. Wear the splints at night.

## 2015-10-01 NOTE — Progress Notes (Signed)
[facility]  PATIENT: Lauren Lester DOB: [birth year]  REFERRING DOCTOR OR PCP:  Phillips Odor  SOURCE: patient, records in EMR  _________________________________   HISTORICAL  CHIEF COMPLAINT:  Chief Complaint  Patient presents with  . Carpal Tunnel    Sts. onset one month ago of pain in the plams of her hands/fingers, bilat.  Sts. she was seen at urgent care, dx. with CTS.  She has been wearing  bilat wrist splints daily, for 14-16 hours and doesn't think they are helping.  Has not had emg/ncv/fim    HISTORY OF PRESENT ILLNESS:  I had the pleasure of seeing your patient, Lauren Lester, at [facility] for neurologic consultation regarding her hand numbness. As you know, she is a 55 year old woman who had the onset of pain in the palms of her hands and fingers about one month ago.  About 4 - 5 weeks ago, she began to experience painful tingling in the palms of both hands. This occurs both day and night. During the day she would notice the pain most of the time and it intensifies periodically, but not always with activity. At night, it would sometimes awaken her from sleep. She also notices some weakness in her hands. She has been dropping items. Recently she got wrist splints but she finds them uncomfortable to wear.Marland Kitchen  She went to urgent care on 09/20/2015 and was diagnosed with bilateral carpal tunnel syndrome. At that time, she was noted to also have a positive Tinel's sign and Phalen's sign at both wrists.   Naproxen was prescribed but she does not think it has been helping her.  She denies any neck pain. There is no numbness or weakness in her legs. She has not noticed any change with her bladder. Bending her neck does not change the symptoms in the hands. For the most part, the pain and tingling is below the wrists but she does have some discomfort near her elbow on the right.  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   She notes a 9 - 10 pound weight gain over the past 2 - 3 months.  She has not had any nerve conduction studies or imaging studies of her neck or extremities.  Notes from urgent care were reviewed.   REVIEW OF SYSTEMS: Constitutional: No fevers, chills, sweats, or change in appetite Eyes: No visual changes, double vision, eye pain Ear, nose and throat: No hearing loss, ear pain, nasal congestion, sore throat Cardiovascular: No chest pain, palpitations Respiratory: No shortness of breath at rest or with exertion.   No wheezes GastrointestinaI: No nausea, vomiting, diarrhea, abdominal pain, fecal incontinence Genitourinary: No dysuria, urinary retention or frequency.  No nocturia. Musculoskeletal: No neck pain, back pain Integumentary: No rash, pruritus, skin lesions Neurological: as above Psychiatric: No depression at this time.  No anxiety Endocrine: No palpitations, diaphoresis, change in appetite, change in weigh or increased thirst Hematologic/Lymphatic: No anemia, purpura, petechiae. Allergic/Immunologic: No itchy/runny eyes, nasal congestion, recent allergic reactions, rashes  ALLERGIES: No Known Allergies  HOME MEDICATIONS:  Current outpatient prescriptions:  .  naproxen sodium (ANAPROX DS) 550 MG tablet, Take 1 tablet (550 mg total) by mouth 2 (two) times daily with a meal., Disp: 40 tablet, Rfl: 0 .  gabapentin (NEURONTIN) 300 MG capsule, Take 1 capsule (300 mg total) by mouth 3 (three) times daily. (Patient not taking: Reported on 09/20/2015), Disp: 60 capsule, Rfl: 0 .  predniSONE (DELTASONE) 20 MG  tablet, Three tablets daily x 1 day then two tablets daily x 5 days then one tablet daily x 5 days (Patient not taking: Reported on 09/20/2015), Disp: 18 tablet, Rfl: 0 .  valACYclovir (VALTREX) 1000 MG tablet, Take 1 tablet (1,000 mg total) by mouth 3 (three) times daily. (Patient not taking: Reported on 09/20/2015), Disp: 30 tablet, Rfl: 0  PAST MEDICAL  HISTORY: Past Medical History  Diagnosis Date  . Substance abuse     PAST SURGICAL HISTORY: Past Surgical History  Procedure Laterality Date  . Abdominal hysterectomy      FAMILY HISTORY: Family History  Problem Relation Age of Onset  . Hypertension Mother     SOCIAL HISTORY:  Social History   Social History  . Marital Status: Single    Spouse Name: N/A  . Number of Children: N/A  . Years of Education: N/A   Occupational History  . Not on file.   Social History Main Topics  . Smoking status: Current Every Day Smoker  . Smokeless tobacco: Not on file  . Alcohol Use: No  . Drug Use: No  . Sexual Activity: Yes   Other Topics Concern  . Not on file   Social History Narrative   Marital status: married      Children: one child; no grandchild.      Lives: with husband.      Employment: Location manager.      Tobacco: 1 ppd      Alcohol: none; previous alcoholism; cessation since 2013.      Drugs:  none     PHYSICAL EXAM  Filed Vitals:   10/01/15 1516  BP: 150/90  Pulse: 74  Resp: 16  Height:  (1.676 m)  Weight: 159 lb (72.122 kg)    Body mass index is 25.68 kg/(m^2).   General: The patient is well-developed and well-nourished and in no acute distress  Neck: The neck is supple,.  The neck is non-tender with a good ROM  Mental status: The patient is alert and oriented x 3 at the time of the examination. The patient has apparent normal recent and remote memory, with an apparently normal attention span and concentration ability.   Speech is normal.  Cranial nerves: Extraocular movements are full.   There is good facial sensation to soft touch bilaterally.Facial strength is normal.  Trapezius and sternocleidomastoid strength is normal. No dysarthria is noted.  The tongue is midline, and the patient has symmetric elevation of the soft palate. No obvious hearing deficits are noted.  Motor:  Muscle bulk is normal.   Tone is normal. Strength is  4+/5 in  both APB hand muscles and  5 / 5 elsewhere in the extremities.   Sensory: Sensory testing is intact to pinprick, soft touch and vibration sensation proximally but she has reduced touch, pp, temp in a median distribution of both hands..  Coordination: Cerebellar testing reveals good finger-nose-finger bilaterally.  Gait and station: Station is normal.   Gait is normal. Tandem gait is mildly wide. Romberg is negative.   Reflexes: Deep tendon reflexes are symmetric.   All reflexes are normal. Reflexes at the knees and ankles are increased and she has spread with a reflex at the knees bilaterally.   Plantar responses are flexor.  Other: She has bilateral Phalen's and Tinel's signs at the wrists.    DIAGNOSTIC DATA (LABS, IMAGING, TESTING) - I reviewed patient records, labs, notes, testing and imaging myself where available.      ASSESSMENT  AND PLAN  Carpal tunnel syndrome, bilateral - Plan: NCV with EMG(electromyography)  Numbness and tingling - Plan: MR Cervical Spine Wo Contrast, NCV with EMG(electromyography)  Hyperreflexia - Plan: MR Cervical Spine Wo Contrast    In summary, Lauren Lester is a 55 year old woman who appears to have bilateral carpal tunnel syndrome.    Additionally, she has hyperreflexia and a mildly wide gait.  Allthough these findings could be idiopathic, they could also be coming from a myelopathy.  For her carpal tunnel syndrome, I proceeded to do bilateral carpal tunnel injections with 1 mg Decadron in 0.6 mL lidocaine, into each wrist.   We will set up a nerve conduction/EMG study to gauge the severity of her CTS and help determine whether she should proceed with surgery.  Given her degree of discomfort, she would likely need to proceed with surgery  I will also check a noncontrasted MRI of the cervical spine due to her hyperreflexia and slight gait disturbance to make sure that there is not a myelopathy or other disorder contributing to these  findings.  I will see her back when she returns for the NCV/EMG and she is to call sooner if she has problems in the meantime  Thank you for asking [place] to see Lauren Lester on for a neurologic consultation. Please note I can be of further assistance with her or other patients in the future.  Richard A. Epimenio Foot, MD, PhD 10/01/2015, 3:35 PM Certified in Neurology, Clinical Neurophysiology, Sleep Medicine, Pain Medicine and Neuroimaging  [facility] [address], [address] [place], [place] [address] [phone]

## 2015-10-08 ENCOUNTER — Telehealth: Payer: Self-pay | Admitting: Neurology

## 2015-10-08 NOTE — Telephone Encounter (Signed)
Dr. Lucia Gaskins,  [employer] denied the MRI CPSINE WO. Patient is scheduled for tomorrow 2/2 @ [facility] @ 5:50PM.  Rosann Auerbach is requesting peer to peer. You can call [employer]# [phone] CASE# [id number].  Please advise with auth or on how to proceed.  Thank you!

## 2015-10-09 ENCOUNTER — Other Ambulatory Visit: Payer: Self-pay

## 2015-10-09 NOTE — Telephone Encounter (Signed)
Sue Lush I did a peer to peer.   The authorization is [id number]  Thank you

## 2015-10-15 ENCOUNTER — Other Ambulatory Visit: Payer: Self-pay | Admitting: Emergency Medicine

## 2015-10-16 ENCOUNTER — Ambulatory Visit (INDEPENDENT_AMBULATORY_CARE_PROVIDER_SITE_OTHER): Payer: Self-pay | Admitting: Neurology

## 2015-10-16 ENCOUNTER — Ambulatory Visit (INDEPENDENT_AMBULATORY_CARE_PROVIDER_SITE_OTHER): Payer: Managed Care, Other (non HMO) | Admitting: Neurology

## 2015-10-16 ENCOUNTER — Encounter: Payer: Self-pay | Admitting: *Deleted

## 2015-10-16 DIAGNOSIS — Z0289 Encounter for other administrative examinations: Secondary | ICD-10-CM

## 2015-10-16 DIAGNOSIS — R202 Paresthesia of skin: Secondary | ICD-10-CM

## 2015-10-16 DIAGNOSIS — G5603 Carpal tunnel syndrome, bilateral upper limbs: Secondary | ICD-10-CM

## 2015-10-16 DIAGNOSIS — G5601 Carpal tunnel syndrome, right upper limb: Secondary | ICD-10-CM

## 2015-10-16 DIAGNOSIS — G5602 Carpal tunnel syndrome, left upper limb: Secondary | ICD-10-CM

## 2015-10-16 DIAGNOSIS — R200 Anesthesia of skin: Secondary | ICD-10-CM

## 2015-10-16 NOTE — Progress Notes (Signed)
   STUDY DATE: 03/16/60 PATIENT NAME: Kiya Eno DOB: 18-Apr-1961 MRN: [id number]  HISTORY:  56 year old woman with bilateral hand numbness and hyperreflexia.  NERVE CONDUCTION STUDIES:  Bilateral median and ulnar motor responses were normal. The median and ulnar F-wave responses were normal and fairly symmetric.   Bilateral median and ulnar sensory responses were normal area palmar median and ulnar sensory responses were also normal and symmetric.  EMG STUDIES:  Needle EMG of select muscles of the left arm was performed. There was no spontaneous activity in any of the muscles tested. Motor unit morphology and recruitment was normal in the deltoid, triceps, biceps, first DIO and APB muscles.   [facility] showed a few polyphasic motor units but recruitment was normal.  IMPRESSION:  This is a normal NCV/EMG study. There was no evidence of neuropathy or significant radiculopathy.   Richard A. Epimenio Foot, MD, PhD Certified in Neurology, Clinical Neurophysiology, Sleep Medicine, Pain Medicine and Neuroimaging  [facility] [address] [place], [place] [address] [phone]

## 2015-10-19 ENCOUNTER — Ambulatory Visit
Admission: RE | Admit: 2015-10-19 | Discharge: 2015-10-19 | Disposition: A | Discharge status: Home / Self Care | Payer: Managed Care, Other (non HMO) | Source: Ambulatory Visit | Attending: Neurology | Admitting: Neurology

## 2015-10-19 DIAGNOSIS — R200 Anesthesia of skin: Secondary | ICD-10-CM

## 2015-10-19 DIAGNOSIS — R202 Paresthesia of skin: Principal | ICD-10-CM

## 2015-10-19 DIAGNOSIS — R292 Abnormal reflex: Secondary | ICD-10-CM

## 2015-10-20 ENCOUNTER — Telehealth: Payer: Self-pay | Admitting: Neurology

## 2015-10-20 NOTE — Telephone Encounter (Signed)
Patient is calling to get the results of hr MRI test done 10/19/15.  Please call [phone].  Thanks!

## 2015-10-20 NOTE — Telephone Encounter (Signed)
PC with Destany--I don't have mri results yet (just done yesterday)-but will call as soon as I do.  RAS is reading this afternoon, so I expect to have a report late this afternoon or tomorrow am.  She verbalized understanding of same--sts. she is at home today--tomorrow will be at work and I can call her cell # [phone]/fim

## 2015-10-21 ENCOUNTER — Telehealth: Payer: Self-pay | Admitting: Neurology

## 2015-10-21 DIAGNOSIS — M5000 Cervical disc disorder with myelopathy, unspecified cervical region: Secondary | ICD-10-CM

## 2015-10-21 NOTE — Telephone Encounter (Signed)
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  I let her know that I want her to see neurosurgery as I believe she will need to have surgery performed.   We will get this scheduled for her

## 2016-07-14 ENCOUNTER — Other Ambulatory Visit: Payer: Self-pay | Admitting: Plastic Surgery

## 2018-05-19 ENCOUNTER — Other Ambulatory Visit: Payer: Self-pay

## 2018-05-19 ENCOUNTER — Encounter (HOSPITAL_COMMUNITY): Payer: Self-pay

## 2018-05-19 ENCOUNTER — Emergency Department (HOSPITAL_COMMUNITY)
Admission: EM | Admit: 2018-05-19 | Discharge: 2018-05-19 | Disposition: A | Discharge status: Home / Self Care | Payer: 59 | Attending: Emergency Medicine | Admitting: Emergency Medicine

## 2018-05-19 DIAGNOSIS — I10 Essential (primary) hypertension: Secondary | ICD-10-CM | POA: Insufficient documentation

## 2018-05-19 DIAGNOSIS — F17210 Nicotine dependence, cigarettes, uncomplicated: Secondary | ICD-10-CM | POA: Diagnosis not present

## 2018-05-19 MED ORDER — AMLODIPINE BESYLATE 5 MG PO TABS: 5.0000 mg | ORAL_TABLET | Freq: Every day | ORAL | 0 refills | Status: DC

## 2018-05-19 NOTE — Discharge Instructions (Addendum)
Start Amlodipine 5mg  once daily for blood pressure control Please follow up with a primary doctor for blood pressure recheck in the next 1-2 weeks Return if you are worsening

## 2018-05-19 NOTE — ED Triage Notes (Signed)
Patient states she stopped at [facility] today and her BP-199/108. Patient states she began having tingling of her left hand. Patient was at an UC yesterday for redness of the right elbow and BP was elevated.  BP in triage-184/102.

## 2018-05-19 NOTE — ED Provider Notes (Signed)
[facility]-EMERGENCY DEPT Provider Note   CSN: [id number] Arrival date & time: 05/19/18  1521     History   Chief Complaint Chief Complaint  Patient presents with  . Hypertension  . Numbness    HPI Lauren Lester is a 57 y.o. female who presents with elevated blood pressure. PMH significant for ETOH abuse, hx of C-spine surgery. She states that several days ago she thinks she was bit by something on the right upper arm. She developed a rash and itching. [facility] and was told that her BP was elevated (190s systolic). She was prescribed prednisone for her symptoms and states that the rash has been improving. Today she became concerned about her BP still being high so went to [facility] and checked it and it was still very high so she came straight here. She denies hx of HTN and has never been on any meds. She denies any headache, vision changes, chest pain, SOB, abdominal pain. She has been urinating normally. She endorses tingling in her left hand but states this is a chronic issues due to her neck surgery.  HPI  Past Medical History:  Diagnosis Date  . Substance abuse [facility](HCC)     Patient Active Problem List   Diagnosis Date Noted  . Carpal tunnel syndrome, bilateral 10/01/2015  . Numbness and tingling 10/01/2015  . 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 Herpes zoster 12/02/2012  . Alcoholism in remission (HCC) 12/02/2012    Past Surgical History:  Procedure Laterality Date  . ABDOMINAL HYSTERECTOMY       OB History   None      Home Medications    Prior to Admission medications   Medication Sig Start Date End Date Taking? Authorizing Provider  gabapentin (NEURONTIN) 300 MG capsule Take 1 capsule (300 mg total) by mouth 3 (three) times daily. Patient not taking: Reported on 09/20/2015 12/02/12   Ethelda ChickSmith, Kristi M, MD  predniSONE (DELTASONE) 20 MG tablet Three tablets daily x 1 day then two tablets daily x 5 days then one tablet daily x 5 days Patient not  taking: Reported on 09/20/2015 12/02/12   Ethelda ChickSmith, Kristi M, MD  traMADol (ULTRAM) 50 MG tablet Take 1 tablet (50 mg total) by mouth 3 (three) times daily as needed. 10/01/15   Sater, Pearletha Furlichard A, MD  valACYclovir (VALTREX) 1000 MG tablet Take 1 tablet (1,000 mg total) by mouth 3 (three) times daily. Patient not taking: Reported on 09/20/2015 12/02/12   Ethelda ChickSmith, Kristi M, MD    Family History Family History  Problem Relation Age of Onset  . Hypertension Mother     Social History Social History   Tobacco Use  . Smoking status: Current Every Day Smoker    Packs/day: 1.00    Types: Cigarettes  . Smokeless tobacco: Never Used  Substance Use Topics  . Alcohol use: Yes    Comment: occasionally  . Drug use: Not Currently     Allergies   Patient has no known allergies.   Review of Systems Review of Systems  Constitutional: Negative for fever.  Eyes: Negative for visual disturbance.  Respiratory: Negative for shortness of breath.   Cardiovascular: Negative for chest pain.  Gastrointestinal: Negative for abdominal pain.  Skin: Positive for rash.  Neurological: Negative for dizziness, syncope and headaches.  All other systems reviewed and are negative.    Physical Exam Updated Vital Signs BP (!) 186/93   Pulse 92   Temp 98.2 F (36.8 C) (Oral)   Resp 17  Ht 5\' 6"  (1.676 m)   Wt 64 kg   SpO2 99%   BMI 22.76 kg/m   Physical Exam  Constitutional: She is oriented to person, place, and time. She appears well-developed and well-nourished. No distress.  Calm and cooperative  HENT:  Head: Normocephalic and atraumatic.  Eyes: Pupils are equal, round, and reactive to light. Conjunctivae are normal. Right eye exhibits no discharge. Left eye exhibits no discharge. No scleral icterus.  Neck: Normal range of motion.  Cardiovascular: Normal rate and regular rhythm.  Pulmonary/Chest: Effort normal and breath sounds normal. No respiratory distress.  Abdominal: Soft. Bowel sounds are  normal. She exhibits no distension. There is no tenderness.  Musculoskeletal:  No peripheral edema  Neurological: She is alert and oriented to person, place, and time.  Skin: Skin is warm and dry.  Psychiatric: She has a normal mood and affect. Her behavior is normal.  Nursing note and vitals reviewed.    ED Treatments / Results  Labs (all labs ordered are listed, but only abnormal results are displayed) Labs Reviewed - No data to display  EKG None  Radiology No results found.  Procedures Procedures (including critical care time)  Medications Ordered in ED Medications - No data to display   Initial Impression / Assessment and Plan / ED Course  I have reviewed the triage vital signs and the nursing notes.  Pertinent labs & imaging results that were available during my care of the patient were reviewed by me and considered in my medical decision making (see chart for details).  57 year old female presents with asymptomatic hypertension. EKG is SR with possible LAE. She has a rash on her arm which is resolving. She will be establishing care with a primary provider. BP is improving without intervention. Will rx Amlodipine. Return precautions given.  Final Clinical Impressions(s) / ED Diagnoses   Final diagnoses:  Asymptomatic hypertension    ED Discharge Orders    None       Bethel Born, PA-C 05/20/18 Quincy Sheehan, MD 05/20/18 [id number]

## 2018-06-12 ENCOUNTER — Other Ambulatory Visit: Payer: Self-pay

## 2018-06-12 ENCOUNTER — Encounter: Payer: Self-pay | Admitting: Family Medicine

## 2018-06-12 ENCOUNTER — Ambulatory Visit: Payer: 59 | Admitting: Family Medicine

## 2018-06-12 VITALS — BP 144/80 | HR 100 | Temp 97.8°F | Ht 65.75 in | Wt 143.0 lb

## 2018-06-12 DIAGNOSIS — F17210 Nicotine dependence, cigarettes, uncomplicated: Secondary | ICD-10-CM | POA: Diagnosis not present

## 2018-06-12 DIAGNOSIS — Z1211 Encounter for screening for malignant neoplasm of colon: Secondary | ICD-10-CM

## 2018-06-12 DIAGNOSIS — Z1231 Encounter for screening mammogram for malignant neoplasm of breast: Secondary | ICD-10-CM | POA: Diagnosis not present

## 2018-06-12 DIAGNOSIS — I10 Essential (primary) hypertension: Secondary | ICD-10-CM | POA: Diagnosis not present

## 2018-06-12 DIAGNOSIS — Z90710 Acquired absence of both cervix and uterus: Secondary | ICD-10-CM | POA: Insufficient documentation

## 2018-06-12 MED ORDER — BUPROPION HCL ER (SR) 150 MG PO TB12: 150.0000 mg | ORAL_TABLET | Freq: Two times a day (BID) | ORAL | 2 refills | Status: DC

## 2018-06-12 MED ORDER — AMLODIPINE BESYLATE 5 MG PO TABS: 5.0000 mg | ORAL_TABLET | Freq: Every day | ORAL | 1 refills | Status: DC

## 2018-06-12 NOTE — Progress Notes (Signed)
 10/7/201910:46 AM  Lauren Lester 10/28/1960, 57 y.o. female [id number]  Chief Complaint  Patient presents with  . Establish Care    Hyertension, needing refill on Norvasc 5mg, Needs assistance on stopping smoke    HPI:   Patient is a 57 y.o. female with past medical history significant for HTN, carpal tunnel syndrome, tobacco use who presents today to establish care  Has not had a PCP in years Reports a partial hysterectomy for fibroids Not sure if cervix was removed or not  Restarted BP medication in the ER Taking amlodipine 5mg every morning  Tolerating well  BP Readings from Last 3 Encounters:  06/12/18 (!) 144/80  05/19/18 (!) 156/93  10/01/15 (!) 150/90   Smokes about 10 cig a day Started smoking at age 20 Quit for 7 months 2 years ago, wo any help Started smoking again when her husband died Lives alone, works, keeps busy Only has one child, a daughter, who lives in [place]  Remote heavy drinking Currently drinks about 2 glasses of wine a day No eye opener, no comments about drinking or guilt,     Fall Risk  06/12/2018 09/20/2015  Falls in the past year? No No     Depression screen PHQ 2/9 06/12/2018 09/20/2015  Decreased Interest 0 0  Down, Depressed, Hopeless 0 0  PHQ - 2 Score 0 0    No Known Allergies  Prior to Admission medications   Medication Sig Start Date End Date Taking? Authorizing Provider  amLODipine (NORVASC) 5 MG tablet Take 1 tablet (5 mg total) by mouth daily. 05/19/18  Yes Gekas, Kelly Marie, PA-C  Methylcellulose, Laxative, (CITRUCEL PO) Take by mouth.   Yes [provider]  multivitamin-iron-minerals-folic acid (CENTRUM) chewable tablet Chew 1 tablet by mouth daily.   Yes [provider]    Past Medical History:  Diagnosis Date  . Carpal tunnel syndrome   . HTN (hypertension)   . Smoker   . Substance abuse (HCC)     Past Surgical History:  Procedure Laterality Date  . ABDOMINAL HYSTERECTOMY       Social History   Tobacco Use  . Smoking status: Current Every Day Smoker    Packs/day: 1.00    Types: Cigarettes  . Smokeless tobacco: Never Used  Substance Use Topics  . Alcohol use: Yes    Comment: occasionally    Family History  Problem Relation Age of Onset  . Hypertension Mother     Review of Systems  Constitutional: Negative for chills and fever.  Respiratory: Negative for cough and shortness of breath.   Cardiovascular: Negative for chest pain, palpitations and leg swelling.  Gastrointestinal: Negative for abdominal pain, heartburn, nausea and vomiting.  All other systems reviewed and are negative.    OBJECTIVE:  Blood pressure (!) 144/80, pulse 100, temperature 97.8 F (36.6 C), temperature source Oral, height 5' 5.75" (1.67 m), weight 143 lb (64.9 kg), SpO2 100 %. Body mass index is 23.26 kg/m.   Repeat BP 138/80  Physical Exam  Constitutional: She is oriented to person, place, and time. She appears well-developed and well-nourished.  HENT:  Head: Normocephalic and atraumatic.  Mouth/Throat: Oropharynx is clear and moist. No oropharyngeal exudate.  Eyes: Pupils are equal, round, and reactive to light. Conjunctivae and EOM are normal. No scleral icterus.  Neck: Neck supple.  Cardiovascular: Normal rate, regular rhythm and normal heart sounds. Exam reveals no gallop and no friction rub.  No murmur heard. Pulmonary/Chest: Effort normal and breath sounds normal.   She has no wheezes. She has no rales.  Musculoskeletal: She exhibits no edema.  Neurological: She is alert and oriented to person, place, and time.  Skin: Skin is warm and dry.  Psychiatric: She has a normal mood and affect.  Nursing note and vitals reviewed.   ASSESSMENT and PLAN  1. Essential hypertension Controlled. Continue current regime.  - TSH - Lipid panel - CBC with Differential/Platelet - CMP14+EGFR - Care order/instruction:  2. Visit for screening mammogram - MM Digital  Screening  3. Screen for colon cancer - Cologuard  4. Cigarette nicotine dependence without complication Smoking cessation instruction/counseling given for more than 10 mins:  counseled patient on the dangers of tobacco use, advised patient to stop smoking, and reviewed strategies to maximize success discussed pharmacological options r/se/b. Trial of wellbutrin.   Other orders - buPROPion (WELLBUTRIN SR) 150 MG 12 hr tablet; Take 1 tablet (150 mg total) by mouth 2 (two) times daily. - amLODipine (NORVASC) 5 MG tablet; Take 1 tablet (5 mg total) by mouth daily.  Return in about 4 weeks (around 07/10/2018) for gyn exam.    Rutherford Guys, MD [facility] [place], [place] [address] Ph.  [phone] Fax [phone]

## 2018-06-12 NOTE — Patient Instructions (Addendum)
Bupropion sustained-release tablets (smoking cessation) What is this medicine? BUPROPION (byoo PROE pee on) is used to help people quit smoking. This medicine may be used for other purposes; ask your health care provider or pharmacist if you have questions. COMMON BRAND NAME(S): Buproban, Zyban What should I tell my health care provider before I take this medicine? They need to know if you have any of these conditions: -an eating disorder, such as anorexia or bulimia -bipolar disorder or psychosis -diabetes or high blood sugar, treated with medication -glaucoma -head injury or brain tumor -heart disease, previous heart attack, or irregular heart beat -high blood pressure -kidney or liver disease -seizures -suicidal thoughts or a previous suicide attempt -Tourette's syndrome -weight loss -an unusual or allergic reaction to bupropion, other medicines, foods, dyes, or preservatives -breast-feeding -pregnant or trying to become pregnant How should I use this medicine? Take this medicine by mouth with a glass of water. Follow the directions on the prescription label. You can take it with or without food. If it upsets your stomach, take it with food. Do not cut, crush or chew this medicine. Take your medicine at regular intervals. If you take this medicine more than once a day, take your second dose at least 8 hours after you take your first dose. To limit difficulty in sleeping, avoid taking this medicine at bedtime. Do not take your medicine more often than directed. Do not stop taking this medicine suddenly except upon the advice of your doctor. Stopping this medicine too quickly may cause serious side effects. A special MedGuide will be given to you by the pharmacist with each prescription and refill. Be sure to read this information carefully each time. Talk to your pediatrician regarding the use of this medicine in children. Special care may be needed. Overdosage: If you think you have  taken too much of this medicine contact a poison control center or emergency room at once. NOTE: This medicine is only for you. Do not share this medicine with others. What if I miss a dose? If you miss a dose, skip the missed dose and take your next tablet at the regular time. There should be at least 8 hours between doses. Do not take double or extra doses. What may interact with this medicine? Do not take this medicine with any of the following medications: -linezolid -MAOIs like Azilect, Carbex, Eldepryl, Marplan, Nardil, and Parnate -methylene blue (injected into a vein) -other medicines that contain bupropion like Wellbutrin This medicine may also interact with the following medications: -alcohol -certain medicines for anxiety or sleep -certain medicines for blood pressure like metoprolol, propranolol -certain medicines for depression or psychotic disturbances -certain medicines for HIV or AIDS like efavirenz, lopinavir, nelfinavir, ritonavir -certain medicines for irregular heart beat like propafenone, flecainide -certain medicines for Parkinson's disease like amantadine, levodopa -certain medicines for seizures like carbamazepine, phenytoin, phenobarbital -cimetidine -clopidogrel -cyclophosphamide -digoxin -furazolidone -isoniazid -nicotine -orphenadrine -procarbazine -steroid medicines like prednisone or cortisone -stimulant medicines for attention disorders, weight loss, or to stay awake -tamoxifen -theophylline -thiotepa -ticlopidine -tramadol -warfarin This list may not describe all possible interactions. Give your health care provider a list of all the medicines, herbs, non-prescription drugs, or dietary supplements you use. Also tell them if you smoke, drink alcohol, or use illegal drugs. Some items may interact with your medicine. What should I watch for while using this medicine? Visit your doctor or health care professional for regular checks on your progress.  This medicine should be used together with a  patient support program. It is important to participate in a behavioral program, counseling, or other support program that is recommended by your health care professional. Patients and their families should watch out for new or worsening thoughts of suicide or depression. Also watch out for sudden changes in feelings such as feeling anxious, agitated, panicky, irritable, hostile, aggressive, impulsive, severely restless, overly excited and hyperactive, or not being able to sleep. If this happens, especially at the beginning of treatment or after a change in dose, call your health care professional. Avoid alcoholic drinks while taking this medicine. Drinking excessive alcoholic beverages, using sleeping or anxiety medicines, or quickly stopping the use of these agents while taking this medicine may increase your risk for a seizure. Do not drive or use heavy machinery until you know how this medicine affects you. This medicine can impair your ability to perform these tasks. Do not take this medicine close to bedtime. It may prevent you from sleeping. Your mouth may get dry. Chewing sugarless gum or sucking hard candy, and drinking plenty of water may help. Contact your doctor if the problem does not go away or is severe. Do not use nicotine patches or chewing gum without the advice of your doctor or health care professional while taking this medicine. You may need to have your blood pressure taken regularly if your doctor recommends that you use both nicotine and this medicine together. What side effects may I notice from receiving this medicine? Side effects that you should report to your doctor or health care professional as soon as possible: -allergic reactions like skin rash, itching or hives, swelling of the face, lips, or tongue -breathing problems -changes in vision -confusion -elevated mood, decreased need for sleep, racing thoughts, impulsive  behavior -fast or irregular heartbeat -hallucinations, loss of contact with reality -increased blood pressure -redness, blistering, peeling or loosening of the skin, including inside the mouth -seizures -suicidal thoughts or other mood changes -unusually weak or tired -vomiting Side effects that usually do not require medical attention (report to your doctor or health care professional if they continue or are bothersome): -constipation -headache -loss of appetite -nausea -tremors -weight loss This list may not describe all possible side effects. Call your doctor for medical advice about side effects. You may report side effects to FDA at 1-800-FDA-1088. Where should I keep my medicine? Keep out of the reach of children. Store at room temperature between 20 and 25 degrees C (68 and 77 degrees F). Protect from light. Keep container tightly closed. Throw away any unused medicine after the expiration date. NOTE: This sheet is a summary. It may not cover all possible information. If you have questions about this medicine, talk to your doctor, pharmacist, or health care provider.  2018 Elsevier/Gold Standard (2016-02-13 13:49:28)  Coping with Quitting Smoking Quitting smoking is a physical and mental challenge. You will face cravings, withdrawal symptoms, and temptation. Before quitting, work with your health care provider to make a plan that can help you cope. Preparation can help you quit and keep you from giving in. How can I cope with cravings? Cravings usually last for 5-10 minutes. If you get through it, the craving will pass. Consider taking the following actions to help you cope with cravings:  Keep your mouth busy: ? Chew sugar-free gum. ? Suck on hard candies or a straw. ? Brush your teeth.  Keep your hands and body busy: ? Immediately change to a different activity when you feel a craving. ? Squeeze or  play with a ball. ? Do an activity or a hobby, like making bead jewelry,  practicing needlepoint, or working with wood. ? Mix up your normal routine. ? Take a short exercise break. Go for a quick walk or run up and down stairs. ? Spend time in public places where smoking is not allowed.  Focus on doing something kind or helpful for someone else.  Call a friend or family member to talk during a craving.  Join a support group.  Call a quit line, such as 1-800-QUIT-NOW.  Talk with your health care provider about medicines that might help you cope with cravings and make quitting easier for you.  How can I deal with withdrawal symptoms? Your body may experience negative effects as it tries to get used to not having nicotine in the system. These effects are called withdrawal symptoms. They may include:  Feeling hungrier than normal.  Trouble concentrating.  Irritability.  Trouble sleeping.  Feeling depressed.  Restlessness and agitation.  Craving a cigarette.  To manage withdrawal symptoms:  Avoid places, people, and activities that trigger your cravings.  Remember why you want to quit.  Get plenty of sleep.  Avoid coffee and other caffeinated drinks. These may worsen some of your symptoms.  How can I handle social situations? Social situations can be difficult when you are quitting smoking, especially in the first few weeks. To manage this, you can:  Avoid parties, bars, and other social situations where people might be smoking.  Avoid alcohol.  Leave right away if you have the urge to smoke.  Explain to your family and friends that you are quitting smoking. Ask for understanding and support.  Plan activities with friends or family where smoking is not an option.  What are some ways I can cope with stress? Wanting to smoke may cause stress, and stress can make you want to smoke. Find ways to manage your stress. Relaxation techniques can help. For example:  Breathe slowly and deeply, in through your nose and out through your  mouth.  Listen to soothing, relaxing music.  Talk with a family member or friend about your stress.  Light a candle.  Soak in a bath or take a shower.  Think about a peaceful place.  What are some ways I can prevent weight gain? Be aware that many people gain weight after they quit smoking. However, not everyone does. To keep from gaining weight, have a plan in place before you quit and stick to the plan after you quit. Your plan should include:  Having healthy snacks. When you have a craving, it may help to: ? Eat plain popcorn, crunchy carrots, celery, or other cut vegetables. ? Chew sugar-free gum.  Changing how you eat: ? Eat small portion sizes at meals. ? Eat 4-6 small meals throughout the day instead of 1-2 large meals a day. ? Be mindful when you eat. Do not watch television or do other things that might distract you as you eat.  Exercising regularly: ? Make time to exercise each day. If you do not have time for a long workout, do short bouts of exercise for 5-10 minutes several times a day. ? Do some form of strengthening exercise, like weight lifting, and some form of aerobic exercise, like running or swimming.  Drinking plenty of water or other low-calorie or no-calorie drinks. Drink 6-8 glasses of water daily, or as much as instructed by your health care provider.  Summary  Quitting smoking is a physical  and mental challenge. You will face cravings, withdrawal symptoms, and temptation to smoke again. Preparation can help you as you go through these challenges.  You can cope with cravings by keeping your mouth busy (such as by chewing gum), keeping your body and hands busy, and making calls to family, friends, or a helpline for people who want to quit smoking.  You can cope with withdrawal symptoms by avoiding places where people smoke, avoiding drinks with caffeine, and getting plenty of rest.  Ask your health care provider about the different ways to prevent weight  gain, avoid stress, and handle social situations. This information is not intended to replace advice given to you by your health care provider. Make sure you discuss any questions you have with your health care provider. Document Released: 08/20/2016 Document Revised: 08/20/2016 Document Reviewed: 08/20/2016 Elsevier Interactive Patient Education  [employer].    If you have lab work done today you will be contacted with your lab results within the next 2 weeks.  If you have not heard from [place] then please contact us. The fastest way to get your results is to register for My Chart.   IF you received an x-ray today, you will receive an invoice from [facility]. Please contact [facility] at [phone] with questions or concerns regarding your invoice.   IF you received labwork today, you will receive an invoice from [facility]. Please contact [facility] at [phone] with questions or concerns regarding your invoice.   Our billing staff will not be able to assist you with questions regarding bills from these companies.  You will be contacted with the lab results as soon as they are available. The fastest way to get your results is to activate your My Chart account. Instructions are located on the last page of this paperwork. If you have not heard from [place] regarding the results in 2 weeks, please contact this office.

## 2018-06-13 LAB — CMP14+EGFR
ALT: 14 IU/L (ref 0–32)
AST: 23 IU/L (ref 0–40)
Albumin/Globulin Ratio: 1.7 (ref 1.2–2.2)
Albumin: 4.8 g/dL (ref 3.5–5.5)
Alkaline Phosphatase: 123 IU/L — ABNORMAL HIGH (ref 39–117)
BUN/Creatinine Ratio: 12 (ref 9–23)
BUN: 8 mg/dL (ref 6–24)
Bilirubin Total: 0.7 mg/dL (ref 0.0–1.2)
CO2: 22 mmol/L (ref 20–29)
Calcium: 10 mg/dL (ref 8.7–10.2)
Chloride: 105 mmol/L (ref 96–106)
Creatinine, Ser: 0.67 mg/dL (ref 0.57–1.00)
GFR calc Af Amer: 112 mL/min/{1.73_m2} (ref >59)
GFR calc non Af Amer: 97 mL/min/{1.73_m2} (ref >59)
Globulin, Total: 2.8 g/dL (ref 1.5–4.5)
Glucose: 90 mg/dL (ref 65–99)
Potassium: 4 mmol/L (ref 3.5–5.2)
Sodium: 143 mmol/L (ref 134–144)
Total Protein: 7.6 g/dL (ref 6.0–8.5)

## 2018-06-13 LAB — LIPID PANEL
Chol/HDL Ratio: 3.6 ratio (ref 0.0–4.4)
Cholesterol, Total: 194 mg/dL (ref 100–199)
HDL: 54 mg/dL (ref >39)
LDL Calculated: 104 mg/dL — ABNORMAL HIGH (ref 0–99)
Triglycerides: 182 mg/dL — ABNORMAL HIGH (ref 0–149)
VLDL Cholesterol Cal: 36 mg/dL (ref 5–40)

## 2018-06-13 LAB — CBC WITH DIFFERENTIAL/PLATELET
Basophils Absolute: 0.1 10*3/uL (ref 0.0–0.2)
Basos: 1 %
EOS (ABSOLUTE): 0.1 10*3/uL (ref 0.0–0.4)
Eos: 1 %
Hematocrit: 45.8 % (ref 34.0–46.6)
Hemoglobin: 15.2 g/dL (ref 11.1–15.9)
Immature Grans (Abs): 0 10*3/uL (ref 0.0–0.1)
Immature Granulocytes: 0 %
Lymphocytes Absolute: 2.8 10*3/uL (ref 0.7–3.1)
Lymphs: 28 %
MCH: 29.3 pg (ref 26.6–33.0)
MCHC: 33.2 g/dL (ref 31.5–35.7)
MCV: 88 fL (ref 79–97)
Monocytes Absolute: 0.7 10*3/uL (ref 0.1–0.9)
Monocytes: 7 %
Neutrophils Absolute: 6.1 10*3/uL (ref 1.4–7.0)
Neutrophils: 63 %
Platelets: 269 10*3/uL (ref 150–450)
RBC: 5.18 x10E6/uL (ref 3.77–5.28)
RDW: 12.8 % (ref 12.3–15.4)
WBC: 9.7 10*3/uL (ref 3.4–10.8)

## 2018-06-13 LAB — TSH: TSH: 1.31 u[IU]/mL (ref 0.450–4.500)

## 2018-07-04 LAB — COLOGUARD: Cologuard: NEGATIVE

## 2018-07-10 ENCOUNTER — Encounter: Payer: Self-pay | Admitting: Family Medicine

## 2018-07-10 ENCOUNTER — Other Ambulatory Visit: Payer: Self-pay

## 2018-07-10 ENCOUNTER — Ambulatory Visit: Payer: 59 | Admitting: Family Medicine

## 2018-07-10 VITALS — BP 158/80 | HR 109 | Temp 98.1°F | Resp 14 | Ht 66.0 in | Wt 144.2 lb

## 2018-07-10 DIAGNOSIS — Z23 Encounter for immunization: Secondary | ICD-10-CM

## 2018-07-10 DIAGNOSIS — I10 Essential (primary) hypertension: Secondary | ICD-10-CM | POA: Diagnosis not present

## 2018-07-10 MED ORDER — AMLODIPINE BESYLATE 10 MG PO TABS: 10.0000 mg | ORAL_TABLET | Freq: Every day | ORAL | 1 refills | Status: DC

## 2018-07-10 NOTE — Patient Instructions (Addendum)
  Increasing amlodipine 10mg  once a day Continue to check BP at home once a day Goal is BP 140/90 or less If not at goal after about 2-3 weeks, please call and I will send a prescription for a diuretic.    If you have lab work done today you will be contacted with your lab results within the next 2 weeks.  If you have not heard from [place] then please contact us. The fastest way to get your results is to register for My Chart.   IF you received an x-ray today, you will receive an invoice from [facility]. Please contact [facility] at [phone] with questions or concerns regarding your invoice.   IF you received labwork today, you will receive an invoice from [place]. Please contact [facility] at [phone] with questions or concerns regarding your invoice.   Our billing staff will not be able to assist you with questions regarding bills from these companies.  You will be contacted with the lab results as soon as they are available. The fastest way to get your results is to activate your My Chart account. Instructions are located on the last page of this paperwork. If you have not heard from [place] regarding the results in 2 weeks, please contact this office.

## 2018-07-10 NOTE — Progress Notes (Signed)
11/4/20193:52 PM  Lauren Lester 09/26/1960, 57 y.o. female [id number]  Chief Complaint  Patient presents with  . Annual Exam    gyno exam    HPI:   Patient is a 57 y.o. female with past medical history significant for HTN, tob use who presents today for pap  Patient reports partial hysterectomy for fibroids, thinks cervix might still be in place Last pap many years ago  Started on wellbutrin for smoking cessation at last visit Feels it is working, has been cutting back, now down to 8 cig a day  Checks BP at home with wrist cuff Readings similar to todays  Fall Risk  07/10/2018 07/10/2018 06/12/2018 09/20/2015  Falls in the past year? 0 0 No No     Depression screen [facility] 2/9 07/10/2018 07/10/2018 06/12/2018  Decreased Interest 0 0 0  Down, Depressed, Hopeless 0 0 0  PHQ - 2 Score 0 0 0    No Known Allergies  Prior to Admission medications   Medication Sig Start Date End Date Taking? Authorizing Provider  amLODipine (NORVASC) 5 MG tablet Take 1 tablet (5 mg total) by mouth daily. 06/12/18  Yes Myles Lipps, MD  buPROPion [place] SR) 150 MG 12 hr tablet Take 1 tablet (150 mg total) by mouth 2 (two) times daily. 06/12/18  Yes Myles Lipps, MD  Methylcellulose, Laxative, (CITRUCEL PO) Take by mouth.   Yes [provider]  multivitamin-iron-minerals-folic acid (CENTRUM) chewable tablet Chew 1 tablet by mouth daily.   Yes [provider]    Past Medical History:  Diagnosis Date  . Carpal tunnel syndrome   . HTN (hypertension)   . Smoker   . Substance abuse [facility])     Past Surgical History:  Procedure Laterality Date  . ABDOMINAL HYSTERECTOMY      Social History   Tobacco Use  . Smoking status: Current Every Day Smoker    Packs/day: 1.00    Types: Cigarettes  . Smokeless tobacco: Never Used  Substance Use Topics  . Alcohol use: Yes    Comment: occasionally    Family History  Problem Relation Age of Onset  . Hypertension Mother       Review of Systems  Constitutional: Negative for chills and fever.  Respiratory: Negative for cough and shortness of breath.   Cardiovascular: Negative for chest pain, palpitations and leg swelling.  Gastrointestinal: Negative for abdominal pain, nausea and vomiting.     OBJECTIVE:  Blood pressure (!) 158/80, pulse (!) 109, temperature 98.1 F (36.7 C), temperature source Oral, resp. rate 14, height 5\' 6"  (1.676 m), weight 144 lb 3.2 oz (65.4 kg), SpO2 98 %. Body mass index is 23.27 kg/m.   BP Readings from Last 3 Encounters:  07/10/18 (!) 158/80  06/12/18 (!) 144/80  05/19/18 (!) 156/93    Physical Exam  Constitutional: She is oriented to person, place, and time. She appears well-developed and well-nourished.  HENT:  Head: Normocephalic and atraumatic.  Mouth/Throat: Mucous membranes are normal.  Eyes: Pupils are equal, round, and reactive to light. Conjunctivae and EOM are normal. No scleral icterus.  Neck: Neck supple.  Pulmonary/Chest: Effort normal.  Genitourinary: Vagina normal. There is no rash or lesion on the right labia. There is no rash or lesion on the left labia.  Genitourinary Comments: Blind pouch Cervix, uterus and adnexa surgically absent  Neurological: She is alert and oriented to person, place, and time.  Skin: Skin is warm and dry.  Psychiatric: She has a normal  mood and affect.  Nursing note and vitals reviewed.   ASSESSMENT and PLAN  1. Essential hypertension Uncontrolled. Increasing amlodipine to 10mg  once a day Continue to check BP at home once a day Goal is BP 140/90 or less If not at goal after about 2-3 weeks, please call and I will send a prescription for a diuretic.   - Care order/instruction:  Other orders - amLODipine (NORVASC) 10 MG tablet; Take 1 tablet (10 mg total) by mouth daily. - Flu Vaccine QUAD 36+ mos IM  Return in about 3 months (around 10/10/2018).    Myles Lipps, MD Primary Care at [facility] [address] [place], [place] [address] Ph.  [phone] Fax [phone]

## 2018-07-11 ENCOUNTER — Encounter: Payer: Self-pay | Admitting: *Deleted

## 2018-08-28 ENCOUNTER — Other Ambulatory Visit: Payer: Self-pay | Admitting: Family Medicine

## 2018-08-28 NOTE — Telephone Encounter (Signed)
Requested medication (s) are due for refill today: yes  Requested medication (s) are on the active medication list: yes  Last refill:  06/12/18  Future visit scheduled: no  Notes to clinic:  Antidepressants - bupropion failed.  Requested Prescriptions  Pending Prescriptions Disp Refills   buPROPion (ZYBAN) 150 MG 12 hr tablet [Pharmacy Med Name: BUPROPION HCL SR 150 MG TABLET] 60 tablet 2    Sig: TAKE 1 TABLET BY MOUTH TWICE A DAY     Psychiatry: Antidepressants - bupropion Failed - 08/28/2018  2:14 AM      Failed - Last BP in normal range    BP Readings from Last 1 Encounters:  07/10/18 (!) 158/80         Passed - Valid encounter within last 6 months    Recent Outpatient Visits          1 month ago Essential hypertension   [facility], Meda CoffeeIrma M, MD   2 months ago Essential hypertension   [facility], Meda CoffeeIrma M, MD   2 years ago Bilateral carpal tunnel syndrome   [facility], Tessa LernerJeffery S, MD   5 years ago Herpes zoster   [facility], Myrle ShengKristi M, MD

## 2018-12-29 ENCOUNTER — Other Ambulatory Visit: Payer: Self-pay | Admitting: Family Medicine

## 2019-01-20 ENCOUNTER — Other Ambulatory Visit: Payer: Self-pay | Admitting: Family Medicine

## 2019-01-20 NOTE — Telephone Encounter (Signed)
Pt needs office visit

## 2019-02-17 ENCOUNTER — Other Ambulatory Visit: Payer: Self-pay | Admitting: Family Medicine

## 2019-03-18 ENCOUNTER — Other Ambulatory Visit: Payer: Self-pay | Admitting: Family Medicine

## 2019-03-18 NOTE — Telephone Encounter (Signed)
Requested Prescriptions  Pending Prescriptions Disp Refills  . amLODipine (NORVASC) 10 MG tablet [Pharmacy Med Name: AMLODIPINE BESYLATE 10 MG TAB] 15 tablet 0    Sig: TAKE 1 TABLET BY MOUTH EVERY DAY     Cardiovascular:  Calcium Channel Blockers Failed - 03/18/2019 11:31 AM      Failed - Last BP in normal range    BP Readings from Last 1 Encounters:  07/10/18 (!) 158/80         Failed - Valid encounter within last 6 months    Recent Outpatient Visits          8 months ago Essential hypertension   [facility], Lilia Argue, MD   9 months ago Essential hypertension   [facility] at [facility], Lilia Argue, MD   3 years ago Bilateral carpal tunnel syndrome   [facility] at [facility], Janalee Dane, MD   6 years ago Herpes zoster   Primary Care at [facility], Renette Butters, MD

## 2019-03-25 ENCOUNTER — Other Ambulatory Visit: Payer: Self-pay | Admitting: Family Medicine

## 2019-03-30 ENCOUNTER — Telehealth: Payer: Self-pay | Admitting: Family Medicine

## 2019-03-30 ENCOUNTER — Other Ambulatory Visit: Payer: Self-pay | Admitting: Family Medicine

## 2019-03-30 NOTE — Telephone Encounter (Signed)
Rx was already sent on 03/30/19

## 2019-03-30 NOTE — Telephone Encounter (Signed)
Requested Prescriptions  Pending Prescriptions Disp Refills  . amLODipine (NORVASC) 10 MG tablet [Pharmacy Med Name: AMLODIPINE BESYLATE 10 MG TAB] 15 tablet 0    Sig: TAKE 1 TABLET BY MOUTH EVERY DAY     Cardiovascular:  Calcium Channel Blockers Failed - 03/30/2019 10:06 AM      Failed - Last BP in normal range    BP Readings from Last 1 Encounters:  07/10/18 (!) 158/80         Failed - Valid encounter within last 6 months    Recent Outpatient Visits          8 months ago Essential hypertension   [facility], Lilia Argue, MD   9 months ago Essential hypertension   [facility] at [facility], Lilia Argue, MD   3 years ago Bilateral carpal tunnel syndrome   [facility] at [facility], Janalee Dane, MD   6 years ago Herpes zoster   Primary Care at [facility], Renette Butters, MD      Future Appointments            In 1 week Rutherford Guys, MD Primary Care at [place], [facility]           Patient has follow up appointment 04/12/2019.  15 day supply of medication sent to last until patient's follow up appointment.

## 2019-03-30 NOTE — Telephone Encounter (Signed)
Pt has appt with [place] 04/12/2019 needs enough BP med until her appt . FR  [facility] [address]

## 2019-04-08 ENCOUNTER — Other Ambulatory Visit: Payer: Self-pay | Admitting: Family Medicine

## 2019-04-12 ENCOUNTER — Ambulatory Visit: Payer: 59 | Admitting: Family Medicine

## 2019-04-12 ENCOUNTER — Other Ambulatory Visit: Payer: Self-pay

## 2019-04-12 ENCOUNTER — Encounter: Payer: Self-pay | Admitting: Family Medicine

## 2019-04-12 VITALS — BP 150/78 | HR 97 | Temp 98.2°F | Ht 66.0 in | Wt 143.8 lb

## 2019-04-12 DIAGNOSIS — I10 Essential (primary) hypertension: Secondary | ICD-10-CM

## 2019-04-12 DIAGNOSIS — F17200 Nicotine dependence, unspecified, uncomplicated: Secondary | ICD-10-CM

## 2019-04-12 DIAGNOSIS — N952 Postmenopausal atrophic vaginitis: Secondary | ICD-10-CM

## 2019-04-12 MED ORDER — LOSARTAN POTASSIUM 25 MG PO TABS: 25.0000 mg | ORAL_TABLET | Freq: Every day | ORAL | 1 refills | Status: DC

## 2019-04-12 MED ORDER — NICOTINE 14 MG/24HR TD PT24: 14.0000 mg | MEDICATED_PATCH | Freq: Every day | TRANSDERMAL | 0 refills | Status: DC

## 2019-04-12 NOTE — Patient Instructions (Addendum)
     If you have lab work done today you will be contacted with your lab results within the next 2 weeks.  If you have not heard from [place] then please contact us. The fastest way to get your results is to register for My Chart.   IF you received an x-ray today, you will receive an invoice from [facility]. Please contact [facility] at [phone] with questions or concerns regarding your invoice.   IF you received labwork today, you will receive an invoice from [place]. Please contact [facility] at [phone] with questions or concerns regarding your invoice.   Our billing staff will not be able to assist you with questions regarding bills from these companies.  You will be contacted with the lab results as soon as they are available. The fastest way to get your results is to activate your My Chart account. Instructions are located on the last page of this paperwork. If you have not heard from [place] regarding the results in 2 weeks, please contact this office.     Atrophic Vaginitis Atrophic vaginitis is a condition in which the tissues that line the vagina become dry and thin. This condition occurs in women who have stopped having their period. It is caused by a drop in a female hormone (estrogen). This hormone helps:  To keep the vagina moist.  To make a clear fluid. This clear fluid helps: ? To make the vagina ready for sex. ? To protect the vagina from infection. If the lining of the vagina is dry and thin, it may cause irritation, burning, or itchiness. It may also:  Make sex painful.  Make an exam of your vagina painful.  Cause bleeding.  Make you lose interest in sex.  Cause a burning feeling when you pee (urinate).  Cause a brown or yellow fluid to come from your vagina. Some women do not have symptoms. Follow these instructions at home: Medicines  Take over-the-counter and prescription medicines only as told by your doctor.  Do not use herbs or  other medicines unless your doctor says it is okay.  Use medicines for for dryness. These include: ? Oils to make the vagina soft. ? Creams. ? Moisturizers. General instructions  Do not douche.  Do not use products that can make your vagina dry. These include: ? Scented sprays. ? Scented tampons. ? Scented soaps.  Sex can help increase blood flow and soften the tissue in the vagina. If it hurts to have sex: ? Tell your partner. ? Use products to make sex more comfortable. Use these only as told by your doctor. Contact a doctor if you:  Have discharge from the vagina that is different than usual.  Have a bad smell coming from your vagina.  Have new symptoms.  Do not get better.  Get worse. Summary  Atrophic vaginitis is a condition in which the lining of the vagina becomes dry and thin.  This condition affects women who have stopped having their periods.  Treatment may include using products that help make the vagina soft.  Call a doctor if do not get better with treatment. This information is not intended to replace advice given to you by your health care provider. Make sure you discuss any questions you have with your health care provider. Document Released: 02/09/2008 Document Revised: 09/05/2017 Document Reviewed: 09/05/2017 Elsevier Patient Education  2020 [employer].

## 2019-04-12 NOTE — Progress Notes (Signed)
8/6/20203:56 PM  Lauren Lester 1961/05/13, 58 y.o., female [id number]  Chief Complaint  Patient presents with  . Hypertension    pt is anxious, will need rp bp after lab draw  . Nicotine Dependence    has not taken this pill in one month    HPI:   Patient is a 58 y.o. female with past medical history significant for HTN and tob use who presents today for routine followup  Last OV nov 2019 for pap amlodipine increased to 52m  Tolerating BP med well Checks BP at home, 140s  Was using wellbutrin for smoking cessation She ran out of meds about 3 months ago She does better when she is working  Currently smoking about 8 cig a day  Wondering about vaginal dryness Not sexually active Denies any UTIs or bladder prolapse Hysterectomy for fibroids   [facility] 04/12/2019 07/10/2018 07/10/2018  Decreased Interest 0 0 0  Down, Depressed, Hopeless 0 0 0  PHQ - 2 Score 0 0 0    Fall Risk  04/12/2019 07/10/2018 07/10/2018 06/12/2018 09/20/2015  Falls in the past year? 0 0 0 No No  Number falls in past yr: 0 - - - -  Injury with Fall? 0 - - - -     No Known Allergies  Prior to Admission medications   Medication Sig Start Date End Date Taking? Authorizing Provider  amLODipine (NORVASC) 10 MG tablet TAKE 1 TABLET BY MOUTH EVERY DAY 03/30/19  Yes SRutherford Guys MD  buPROPion [facility]) 150 MG 12 hr tablet Take 1 tablet (150 mg total) by mouth 2 (two) times daily. 06/12/18  Yes SRutherford Guys MD  multivitamin-iron-minerals-folic acid (CENTRUM) chewable tablet Chew 1 tablet by mouth daily.   Yes [provider]    Past Medical History:  Diagnosis Date  . Carpal tunnel syndrome   . HTN (hypertension)   . Smoker   . Substance abuse ([facility]     Past Surgical History:  Procedure Laterality Date  . ABDOMINAL HYSTERECTOMY      Social History   Tobacco Use  . Smoking status: Current Every Day Smoker    Packs/day: 1.00    Types: Cigarettes  .  Smokeless tobacco: Never Used  Substance Use Topics  . Alcohol use: Yes    Comment: occasionally    Family History  Problem Relation Age of Onset  . Hypertension Mother     Review of Systems  Constitutional: Negative for chills and fever.  Respiratory: Negative for cough and shortness of breath.   Cardiovascular: Negative for chest pain, palpitations and leg swelling.  Gastrointestinal: Negative for abdominal pain, nausea and vomiting.     OBJECTIVE:  Today's Vitals   04/12/19 1525 04/12/19 1622  BP: (!) 153/88 (!) 150/78  Pulse: 97   Temp: 98.2 F (36.8 C)   TempSrc: Oral   SpO2: 100%   Weight: 143 lb 12.8 oz (65.2 kg)   Height: 5' 6"  (1.676 m)    Body mass index is 23.21 kg/m.   Physical Exam Vitals signs and nursing note reviewed.  Constitutional:      Appearance: She is well-developed.  HENT:     Head: Normocephalic and atraumatic.     Mouth/Throat:     Pharynx: No oropharyngeal exudate.  Eyes:     General: No scleral icterus.    Conjunctiva/sclera: Conjunctivae normal.     Pupils: Pupils are equal, round, and reactive to light.  Neck:  Musculoskeletal: Neck supple.  Cardiovascular:     Rate and Rhythm: Normal rate and regular rhythm.     Heart sounds: Normal heart sounds. No murmur. No friction rub. No gallop.   Pulmonary:     Effort: Pulmonary effort is normal.     Breath sounds: Normal breath sounds. No wheezing or rales.  Skin:    General: Skin is warm and dry.  Neurological:     Mental Status: She is alert and oriented to person, place, and time.       ASSESSMENT and PLAN  1. Essential hypertension Uncontrolled. Adding losartan 17m once a day - Lipid panel - TSH - CMP14+EGFR  2. Tobacco use disorder Trial of patch, cont working of cutting back/quit  3. Vaginal atrophy Discussed vaginal moisturizers, consider low vaginal estrogen  Other orders - nicotine (NICODERM CQ - DOSED IN MG/24 HOURS) 14 mg/24hr patch; Place 1 patch (14  mg total) onto the skin daily. - losartan (COZAAR) 25 MG tablet; Take 1 tablet (25 mg total) by mouth daily.  Return in about 3 months (around 07/13/2019).    IRutherford Guys MD [facility] [place] [id number].  [phone] [phone]

## 2019-04-13 LAB — LIPID PANEL
Chol/HDL Ratio: 3.5 ratio (ref 0.0–4.4)
Cholesterol, Total: 181 mg/dL (ref 100–199)
HDL: 52 mg/dL (ref >39)
LDL Calculated: 98 mg/dL (ref 0–99)
Triglycerides: 153 mg/dL — ABNORMAL HIGH (ref 0–149)
VLDL Cholesterol Cal: 31 mg/dL (ref 5–40)

## 2019-04-13 LAB — CMP14+EGFR
ALT: 14 IU/L (ref 0–32)
AST: 22 IU/L (ref 0–40)
Albumin/Globulin Ratio: 1.9 (ref 1.2–2.2)
Albumin: 4.7 g/dL (ref 3.8–4.9)
Alkaline Phosphatase: 107 IU/L (ref 39–117)
BUN/Creatinine Ratio: 25 — ABNORMAL HIGH (ref 9–23)
BUN: 17 mg/dL (ref 6–24)
Bilirubin Total: 0.4 mg/dL (ref 0.0–1.2)
CO2: 21 mmol/L (ref 20–29)
Calcium: 10.1 mg/dL (ref 8.7–10.2)
Chloride: 107 mmol/L — ABNORMAL HIGH (ref 96–106)
Creatinine, Ser: 0.68 mg/dL (ref 0.57–1.00)
GFR calc Af Amer: 112 mL/min/{1.73_m2} (ref >59)
GFR calc non Af Amer: 97 mL/min/{1.73_m2} (ref >59)
Globulin, Total: 2.5 g/dL (ref 1.5–4.5)
Glucose: 95 mg/dL (ref 65–99)
Potassium: 3.6 mmol/L (ref 3.5–5.2)
Sodium: 143 mmol/L (ref 134–144)
Total Protein: 7.2 g/dL (ref 6.0–8.5)

## 2019-04-13 LAB — TSH: TSH: 0.78 u[IU]/mL (ref 0.450–4.500)

## 2019-04-27 ENCOUNTER — Other Ambulatory Visit: Payer: Self-pay | Admitting: Family Medicine

## 2019-04-27 NOTE — Telephone Encounter (Signed)
Forwarding medication refill request to the clinical pool for review. 

## 2019-04-30 ENCOUNTER — Encounter: Payer: Self-pay | Admitting: Radiology

## 2019-05-12 ENCOUNTER — Other Ambulatory Visit: Payer: Self-pay | Admitting: Family Medicine

## 2019-07-13 ENCOUNTER — Other Ambulatory Visit: Payer: Self-pay

## 2019-07-13 ENCOUNTER — Encounter: Payer: Self-pay | Admitting: Family Medicine

## 2019-07-13 ENCOUNTER — Ambulatory Visit: Payer: 59 | Admitting: Family Medicine

## 2019-07-13 VITALS — BP 128/75 | HR 103 | Temp 98.3°F | Resp 12 | Ht 66.0 in | Wt 144.0 lb

## 2019-07-13 DIAGNOSIS — F1021 Alcohol dependence, in remission: Secondary | ICD-10-CM | POA: Diagnosis not present

## 2019-07-13 DIAGNOSIS — Z23 Encounter for immunization: Secondary | ICD-10-CM | POA: Diagnosis not present

## 2019-07-13 DIAGNOSIS — F17200 Nicotine dependence, unspecified, uncomplicated: Secondary | ICD-10-CM

## 2019-07-13 DIAGNOSIS — I10 Essential (primary) hypertension: Secondary | ICD-10-CM

## 2019-07-13 MED ORDER — LOSARTAN POTASSIUM 25 MG PO TABS: 25.0000 mg | ORAL_TABLET | Freq: Every day | ORAL | 1 refills | Status: DC

## 2019-07-13 MED ORDER — AMLODIPINE BESYLATE 10 MG PO TABS: 10.0000 mg | ORAL_TABLET | Freq: Every day | ORAL | 1 refills | Status: DC

## 2019-07-13 NOTE — Progress Notes (Signed)
11/6/20202:35 PM  Lauren Lester 06-Nov-1960, 58 y.o., female [id number]  Chief Complaint  Patient presents with  . Hypertension    HPI:   Patient is a 58 y.o. female with past medical history significant for HTN and tob use who presents today for routine followup  Last OV Aug 2020 Added losartan 25mg  - tolerating well Trial of nicotine patches - using sporadically, cont to smoke about 1/3 ppd  Had DUI several years ago Denies any recent/current etoh use  Trying to reinstate her DL, needs an interlock system, unable to blow adequately, needs medical eval   BP Readings from Last 3 Encounters:  07/13/19 128/75  04/12/19 (!) 150/78  07/10/18 (!) 158/80   Wt Readings from Last 3 Encounters:  07/13/19 144 lb (65.3 kg)  04/12/19 143 lb 12.8 oz (65.2 kg)  07/10/18 144 lb 3.2 oz (65.4 kg)    Depression screen [facility] 2/9 07/13/2019 04/12/2019 07/10/2018  Decreased Interest 0 0 0  Down, Depressed, Hopeless 0 0 0  PHQ - 2 Score 0 0 0    Fall Risk  07/13/2019 04/12/2019 07/10/2018 07/10/2018 06/12/2018  Falls in the past year? 0 0 0 0 No  Number falls in past yr: 0 0 - - -  Injury with Fall? 0 0 - - -     No Known Allergies  Prior to Admission medications   Medication Sig Start Date End Date Taking? Authorizing Provider  amLODipine (NORVASC) 10 MG tablet TAKE 1 TABLET BY MOUTH EVERY DAY 04/30/19  Yes Rutherford Guys, MD  losartan (COZAAR) 25 MG tablet Take 1 tablet (25 mg total) by mouth daily. 04/12/19  Yes Rutherford Guys, MD  multivitamin-iron-minerals-folic acid (CENTRUM) chewable tablet Chew 1 tablet by mouth daily.   Yes [provider]  nicotine (NICODERM CQ - DOSED IN MG/24 HOURS) 14 mg/24hr patch PLACE 1 PATCH (14 MG TOTAL) ONTO THE SKIN DAILY. 05/12/19  Yes Rutherford Guys, MD    Past Medical History:  Diagnosis Date  . Carpal tunnel syndrome   . HTN (hypertension)   . Smoker   . Substance abuse [facility])     Past Surgical History:  Procedure Laterality Date   . ABDOMINAL HYSTERECTOMY      Social History   Tobacco Use  . Smoking status: Current Every Day Smoker    Packs/day: 1.00    Types: Cigarettes  . Smokeless tobacco: Never Used  Substance Use Topics  . Alcohol use: Yes    Comment: occasionally    Family History  Problem Relation Age of Onset  . Hypertension Mother     Review of Systems  Constitutional: Negative for chills and fever.  Respiratory: Negative for cough and shortness of breath.   Cardiovascular: Negative for chest pain, palpitations and leg swelling.  Gastrointestinal: Negative for abdominal pain, nausea and vomiting.  per hpi   OBJECTIVE:  Today's Vitals   07/13/19 1424  BP: 128/75  Pulse: (!) 103  Resp: 12  Temp: 98.3 F (36.8 C)  SpO2: 99%  Weight: 144 lb (65.3 kg)  Height: 5\' 6"  (1.676 m)   Body mass index is 23.24 kg/m.   Physical Exam Vitals signs and nursing note reviewed.  Constitutional:      Appearance: She is well-developed.  HENT:     Head: Normocephalic and atraumatic.     Mouth/Throat:     Pharynx: No oropharyngeal exudate.  Eyes:     General: No scleral icterus.    Conjunctiva/sclera: Conjunctivae normal.  Pupils: Pupils are equal, round, and reactive to light.  Neck:     Musculoskeletal: Neck supple.  Cardiovascular:     Rate and Rhythm: Normal rate and regular rhythm.     Heart sounds: Normal heart sounds. No murmur. No friction rub. No gallop.   Pulmonary:     Effort: Pulmonary effort is normal.     Breath sounds: Normal breath sounds. No wheezing or rales.  Musculoskeletal:     Right lower leg: No edema.     Left lower leg: Edema (trace ankle edema) present.  Skin:    General: Skin is warm and dry.  Neurological:     Mental Status: She is alert and oriented to person, place, and time.     No results found for this or any previous visit (from the past 24 hour(s)).  No results found.   ASSESSMENT and PLAN  1. Essential hypertension Controlled. Continue  current regime.  - Basic Metabolic Panel  2. Tobacco use disorder Cont working on weaning/quiting.  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 - Ambulatory referral to Pulmonology  4. Need for immunization against influenza - Flu Vaccine QUAD 36+ mos IM  Other orders - amLODipine (NORVASC) 10 MG tablet; Take 1 tablet (10 mg total) by mouth daily. - losartan (COZAAR) 25 MG tablet; Take 1 tablet (25 mg total) by mouth daily.  Return in about 6 months (around 01/10/2020).    Myles Lipps, MD Primary Care at [facility] [address] [place], [place] [address] Ph.  [phone] Fax [phone]

## 2019-07-13 NOTE — Patient Instructions (Signed)
° ° ° °  If you have lab work done today you will be contacted with your lab results within the next 2 weeks.  If you have not heard from us then please contact us. The fastest way to get your results is to register for My Chart. ° ° °IF you received an x-ray today, you will receive an invoice from [facility]. Please contact  Radiology at [phone] with questions or concerns regarding your invoice.  ° °IF you received labwork today, you will receive an invoice from [facility]. Please contact [facility] at [phone] with questions or concerns regarding your invoice.  ° °Our billing staff will not be able to assist you with questions regarding bills from these companies. ° °You will be contacted with the lab results as soon as they are available. The fastest way to get your results is to activate your My Chart account. Instructions are located on the last page of this paperwork. If you have not heard from us regarding the results in 2 weeks, please contact this office. °  ° ° ° °

## 2019-07-14 LAB — BASIC METABOLIC PANEL
BUN/Creatinine Ratio: 14 (ref 9–23)
BUN: 9 mg/dL (ref 6–24)
CO2: 21 mmol/L (ref 20–29)
Calcium: 9.5 mg/dL (ref 8.7–10.2)
Chloride: 105 mmol/L (ref 96–106)
Creatinine, Ser: 0.66 mg/dL (ref 0.57–1.00)
GFR calc Af Amer: 112 mL/min/{1.73_m2} (ref >59)
GFR calc non Af Amer: 97 mL/min/{1.73_m2} (ref >59)
Glucose: 80 mg/dL (ref 65–99)
Potassium: 4 mmol/L (ref 3.5–5.2)
Sodium: 140 mmol/L (ref 134–144)

## 2019-07-16 ENCOUNTER — Encounter: Payer: Self-pay | Admitting: Radiology

## 2019-07-24 ENCOUNTER — Other Ambulatory Visit: Payer: Self-pay | Admitting: Family Medicine

## 2020-01-04 ENCOUNTER — Other Ambulatory Visit: Payer: Self-pay | Admitting: Family Medicine

## 2020-01-10 ENCOUNTER — Institutional Professional Consult (permissible substitution): Payer: 59 | Admitting: Pulmonary Disease

## 2020-01-11 ENCOUNTER — Ambulatory Visit: Payer: 59 | Admitting: Family Medicine

## 2020-03-17 ENCOUNTER — Other Ambulatory Visit: Payer: Self-pay | Admitting: Family Medicine

## 2020-03-27 ENCOUNTER — Other Ambulatory Visit: Payer: Self-pay | Admitting: Family Medicine

## 2020-03-27 NOTE — Telephone Encounter (Signed)
Requested  medications are  due for refill today yes  Requested medications are on the active medication list yes  Last refill 4/30  Last visit 07/2019  Future visit scheduled NO  Notes to clinic  Failed protocol of visit within 6 months

## 2020-04-08 ENCOUNTER — Other Ambulatory Visit: Payer: Self-pay | Admitting: Family Medicine

## 2020-05-02 ENCOUNTER — Other Ambulatory Visit: Payer: Self-pay | Admitting: Family Medicine

## 2020-05-02 MED ORDER — AMLODIPINE BESYLATE 10 MG PO TABS: 10.0000 mg | ORAL_TABLET | Freq: Every day | ORAL | 0 refills | Status: DC

## 2020-05-02 NOTE — Telephone Encounter (Signed)
Medication Refill - Medication: Amlodipine   Has the patient contacted their pharmacy? No. (Agent: If no, request that the patient contact the pharmacy for the refill.) (Agent: If yes, when and what did the pharmacy advise?)  Preferred Pharm[facility] (with phone number [facility] street [facility] [address]  Agent: Please be advised that RX refills may take up to 3 business days. We ask that you follow-up with your pharmacy.

## 2020-05-02 NOTE — Telephone Encounter (Signed)
Attempted to call patient for appointment- left message to call office to schedule. Courtesy RF #[phone] given

## 2020-05-13 ENCOUNTER — Ambulatory Visit: Payer: 59 | Admitting: Registered Nurse

## 2020-05-13 ENCOUNTER — Other Ambulatory Visit: Payer: Self-pay

## 2020-05-13 ENCOUNTER — Encounter: Payer: Self-pay | Admitting: Registered Nurse

## 2020-05-13 VITALS — BP 157/83 | HR 82 | Temp 97.8°F | Resp 18 | Ht 66.0 in | Wt 145.0 lb

## 2020-05-13 DIAGNOSIS — R21 Rash and other nonspecific skin eruption: Secondary | ICD-10-CM

## 2020-05-13 DIAGNOSIS — F17200 Nicotine dependence, unspecified, uncomplicated: Secondary | ICD-10-CM

## 2020-05-13 DIAGNOSIS — I10 Essential (primary) hypertension: Secondary | ICD-10-CM

## 2020-05-13 MED ORDER — NICOTINE 14 MG/24HR TD PT24: 14.0000 mg | MEDICATED_PATCH | Freq: Every day | TRANSDERMAL | 1 refills | Status: DC

## 2020-05-13 MED ORDER — LOSARTAN POTASSIUM 25 MG PO TABS: 25.0000 mg | ORAL_TABLET | Freq: Every day | ORAL | 1 refills | Status: DC

## 2020-05-13 MED ORDER — TRIAMCINOLONE ACETONIDE 0.1 % EX CREA: 1.0000 "application " | TOPICAL_CREAM | Freq: Two times a day (BID) | CUTANEOUS | 0 refills | Status: DC

## 2020-05-13 NOTE — Patient Instructions (Signed)
° ° ° °  If you have lab work done today you will be contacted with your lab results within the next 2 weeks.  If you have not heard from us then please contact us. The fastest way to get your results is to register for My Chart. ° ° °IF you received an x-ray today, you will receive an invoice from [facility]. Please contact [facility] at [phone] with questions or concerns regarding your invoice.  ° °IF you received labwork today, you will receive an invoice from [facility]. Please contact [facility] at [phone] with questions or concerns regarding your invoice.  ° °Our billing staff will not be able to assist you with questions regarding bills from these companies. ° °You will be contacted with the lab results as soon as they are available. The fastest way to get your results is to activate your My Chart account. Instructions are located on the last page of this paperwork. If you have not heard from us regarding the results in 2 weeks, please contact this office. °  ° ° ° °

## 2020-05-13 NOTE — Progress Notes (Signed)
Acute Office Visit  Subjective:    Patient ID: Lauren Lester, female    DOB: 05/27/1961, 59 y.o.   MRN: [id number]  Chief Complaint  Patient presents with  . Insect Bite    patient states she noticed some bumps on her legs on sunday that  mustve bit her in her sleep. Bumps are on both legs and right side. Per patient they are very itchy and red.    HPI Patient is in today for rash  Starting Sunday night noted very distinct small lesions arise on legs and trunk. Most are around R knee and R hip. No definite pattern. They are itchy. No drainage. No known infestations. Lives alone. No known exposures. No changes to hygenic products, work, home, or other factors. Not particularly itchy at night.   No systemic symptoms No clear pattern to spread  Past Medical History:  Diagnosis Date  . Carpal tunnel syndrome   . HTN (hypertension)   . Smoker   . Substance abuse [facility])     Past Surgical History:  Procedure Laterality Date  . ABDOMINAL HYSTERECTOMY      Family History  Problem Relation Age of Onset  . Hypertension Mother     Social History   Socioeconomic History  . Marital status: Single    Spouse name: Not on file  . Number of children: Not on file  . Years of education: Not on file  . Highest education level: Not on file  Occupational History  . Not on file  Tobacco Use  . Smoking status: Current Every Day Smoker    Packs/day: 1.00    Types: Cigarettes  . Smokeless tobacco: Never Used  Vaping Use  . Vaping Use: Some days  Substance and Sexual Activity  . Alcohol use: Yes    Comment: occasionally  . Drug use: Not Currently  . Sexual activity: Yes  Other Topics Concern  . Not on file  Social History Narrative   Marital status: married      Children: one child; no grandchild.      Lives: with husband.      Employment: Location manager.      Tobacco: 1 ppd      Alcohol: none; previous alcoholism; cessation since 2013.      Drugs:  none   Social  Determinants of Health   Financial Resource Strain:   . Difficulty of Paying Living Expenses: Not on file  Food Insecurity:   . Worried About Programme researcher, broadcasting/film/video in the Last Year: Not on file  . Ran Out of Food in the Last Year: Not on file  Transportation Needs:   . Lack of Transportation (Medical): Not on file  . Lack of Transportation (Non-Medical): Not on file  Physical Activity:   . Days of Exercise per Week: Not on file  . Minutes of Exercise per Session: Not on file  Stress:   . Feeling of Stress : Not on file  Social Connections:   . Frequency of Communication with Friends and Family: Not on file  . Frequency of Social Gatherings with Friends and Family: Not on file  . Attends Religious Services: Not on file  . Active Member of Clubs or Organizations: Not on file  . Attends Banker Meetings: Not on file  . Marital Status: Not on file  Intimate Partner Violence:   . Fear of Current or Ex-Partner: Not on file  . Emotionally Abused: Not on file  . Physically Abused: Not  on file  . Sexually Abused: Not on file    Outpatient Medications Prior to Visit  Medication Sig Dispense Refill  . amLODipine (NORVASC) 10 MG tablet Take 1 tablet (10 mg total) by mouth daily. 30 tablet 0  . multivitamin-iron-minerals-folic acid (CENTRUM) chewable tablet Chew 1 tablet by mouth daily.    Marland Kitchen losartan (COZAAR) 25 MG tablet TAKE 1 TABLET BY MOUTH EVERY DAY 30 tablet 0  . nicotine (NICODERM CQ - DOSED IN MG/24 HOURS) 14 mg/24hr patch PLACE 1 PATCH (14 MG TOTAL) ONTO THE SKIN DAILY. 28 patch 1   No facility-administered medications prior to visit.    No Known Allergies  Review of Systems  Constitutional: Negative.   HENT: Negative.   Eyes: Negative.   Respiratory: Negative.   Cardiovascular: Negative.   Gastrointestinal: Negative.   Endocrine: Negative.   Genitourinary: Negative.   Musculoskeletal: Negative.   Skin: Positive for rash. Negative for color change, pallor and  wound.  Allergic/Immunologic: Negative.   Neurological: Negative.   Hematological: Negative.   Psychiatric/Behavioral: Negative.   All other systems reviewed and are negative.      Objective:    Physical Exam Vitals and nursing note reviewed.  Constitutional:      General: She is not in acute distress.    Appearance: Normal appearance. She is normal weight. She is not ill-appearing, toxic-appearing or diaphoretic.  Cardiovascular:     Rate and Rhythm: Normal rate.  Pulmonary:     Effort: Pulmonary effort is normal. No respiratory distress.  Skin:    Findings: Rash present. Rash is vesicular.  Neurological:     General: No focal deficit present.     Mental Status: She is alert and oriented to person, place, and time. Mental status is at baseline.  Psychiatric:        Mood and Affect: Mood normal.        Behavior: Behavior normal.        Thought Content: Thought content normal.        Judgment: Judgment normal.     BP (!) 157/83   Pulse 82   Temp 97.8 F (36.6 C) (Temporal)   Resp 18   Ht 5\' 6"  (1.676 m)   Wt 145 lb (65.8 kg)   SpO2 96%   BMI 23.40 kg/m  Wt Readings from Last 3 Encounters:  05/13/20 145 lb (65.8 kg)  07/13/19 144 lb (65.3 kg)  04/12/19 143 lb 12.8 oz (65.2 kg)    There are no preventive care reminders to display for this patient.  There are no preventive care reminders to display for this patient.   Lab Results  Component Value Date   TSH 0.780 04/12/2019   Lab Results  Component Value Date   WBC 9.7 06/12/2018   HGB 15.2 06/12/2018   HCT 45.8 06/12/2018   MCV 88 06/12/2018   PLT 269 06/12/2018   Lab Results  Component Value Date   NA 140 07/13/2019   K 4.0 07/13/2019   CO2 21 07/13/2019   GLUCOSE 80 07/13/2019   BUN 9 07/13/2019   CREATININE 0.66 07/13/2019   BILITOT 0.4 04/12/2019   ALKPHOS 107 04/12/2019   AST 22 04/12/2019   ALT 14 04/12/2019   PROT 7.2 04/12/2019   ALBUMIN 4.7 04/12/2019   CALCIUM 9.5 07/13/2019    Lab Results  Component Value Date   CHOL 181 04/12/2019   Lab Results  Component Value Date   HDL 52 04/12/2019   Lab Results  Component Value Date   LDLCALC 98 04/12/2019   Lab Results  Component Value Date   TRIG 153 (H) 04/12/2019   Lab Results  Component Value Date   CHOLHDL 3.5 04/12/2019   No results found for: HGBA1C     Assessment & Plan:   Problem List Items Addressed This Visit      Cardiovascular and Mediastinum   Essential hypertension   Relevant Medications   losartan (COZAAR) 25 MG tablet    Other Visit Diagnoses    Rash and nonspecific skin eruption    -  Primary   Relevant Medications   triamcinolone cream (KENALOG) 0.1 %   Tobacco use disorder       Relevant Medications   nicotine (NICODERM CQ - DOSED IN MG/24 HOURS) 14 mg/24hr patch       Meds ordered this encounter  Medications  . triamcinolone cream (KENALOG) 0.1 %    Sig: Apply 1 application topically 2 (two) times daily.    Dispense:  30 g    Refill:  0    Order Specific Question:   Supervising Provider    Answer:   Neva Seat, JEFFREY R [[id number]]  . losartan (COZAAR) 25 MG tablet    Sig: Take 1 tablet (25 mg total) by mouth daily.    Dispense:  90 tablet    Refill:  1    Order Specific Question:   Supervising Provider    Answer:   Neva Seat, JEFFREY R [[id number]]  . nicotine (NICODERM CQ - DOSED IN MG/24 HOURS) 14 mg/24hr patch    Sig: Place 1 patch (14 mg total) onto the skin daily.    Dispense:  28 patch    Refill:  1    Order Specific Question:   Supervising Provider    Answer:   Neva Seat, JEFFREY R [[id number]]   PLAN  Unclear etiology  Pt to check for infestations at home and monitor spread  Triamcinolone for itch  Refill losartan and nicotine patches  Return in 2 weeks for nurse visit bp check  Patient encouraged to call clinic with any questions, comments, or concerns.   Janeece Agee, NP

## 2020-05-24 ENCOUNTER — Other Ambulatory Visit: Payer: Self-pay | Admitting: Family Medicine

## 2020-05-24 NOTE — Telephone Encounter (Signed)
Requested medication (s) are due for refill today: yes  Requested medication (s) are on the active medication list: yes  Last refill:  -  Future visit scheduled: no  Notes to clinic:  needs appt   Requested Prescriptions  Pending Prescriptions Disp Refills   amLODipine (NORVASC) 10 MG tablet [Pharmacy Med Name: AMLODIPINE BESYLATE 10 MG TAB] 30 tablet 0    Sig: TAKE 1 TABLET BY MOUTH EVERY DAY      Cardiovascular:  Calcium Channel Blockers Failed - 05/24/2020  1:33 PM      Failed - Last BP in normal range    BP Readings from Last 1 Encounters:  05/13/20 (!) 157/83          Passed - Valid encounter within last 6 months    Recent Outpatient Visits           1 week ago Rash and nonspecific skin eruption   [facility], Gerlene Burdock, NP   10 months ago Essential hypertension   [facility], Meda Coffee, MD   1 year ago Essential hypertension   [facility], Meda Coffee, MD   1 year ago Essential hypertension   [facility], Meda Coffee, MD   1 year ago Essential hypertension   [facility], Meda Coffee, MD

## 2020-05-27 ENCOUNTER — Other Ambulatory Visit: Payer: Self-pay

## 2020-05-27 ENCOUNTER — Ambulatory Visit (INDEPENDENT_AMBULATORY_CARE_PROVIDER_SITE_OTHER): Payer: 59 | Admitting: Registered Nurse

## 2020-05-27 VITALS — BP 159/89

## 2020-05-27 DIAGNOSIS — I10 Essential (primary) hypertension: Secondary | ICD-10-CM

## 2020-05-27 NOTE — Progress Notes (Signed)
Patient came in for a BP check per provider. She reports no concerns at this time.

## 2020-07-16 ENCOUNTER — Telehealth: Payer: Self-pay | Admitting: *Deleted

## 2020-07-16 NOTE — Telephone Encounter (Signed)
Schedule a mammo

## 2020-08-19 ENCOUNTER — Other Ambulatory Visit: Payer: Self-pay

## 2020-08-19 DIAGNOSIS — I10 Essential (primary) hypertension: Secondary | ICD-10-CM

## 2020-08-19 MED ORDER — AMLODIPINE BESYLATE 10 MG PO TABS: 10.0000 mg | ORAL_TABLET | Freq: Every day | ORAL | 0 refills | Status: DC

## 2020-10-31 ENCOUNTER — Other Ambulatory Visit: Payer: Self-pay | Admitting: Registered Nurse

## 2020-10-31 DIAGNOSIS — I10 Essential (primary) hypertension: Secondary | ICD-10-CM

## 2020-11-12 ENCOUNTER — Other Ambulatory Visit: Payer: Self-pay | Admitting: Registered Nurse

## 2020-11-12 DIAGNOSIS — I10 Essential (primary) hypertension: Secondary | ICD-10-CM

## 2020-11-12 NOTE — Telephone Encounter (Signed)
Requested medication (s) are due for refill today: yes  Requested medication (s) are on the active medication list: yes  Last refill: 08/19/2020  Future visit scheduled: no  Notes to clinic: overdue for follow up appointment   Requested Prescriptions  Pending Prescriptions Disp Refills   amLODipine (NORVASC) 10 MG tablet [Pharmacy Med Name: AMLODIPINE BESYLATE 10 MG TAB] 90 tablet 0    Sig: TAKE 1 TABLET BY MOUTH EVERY DAY      Cardiovascular:  Calcium Channel Blockers Failed - 11/12/2020  3:24 AM      Failed - Last BP in normal range    BP Readings from Last 1 Encounters:  05/27/20 (!) 159/89          Failed - Valid encounter within last 6 months    Recent Outpatient Visits           6 months ago Rash and nonspecific skin eruption   [facility], Gerlene Burdock, NP   1 year ago Essential hypertension   [facility] at [facility], Meda Coffee, MD   1 year ago Essential hypertension   Primary Care at [facility], Meda Coffee, MD   2 years ago Essential hypertension   Primary Care at [facility], Meda Coffee, MD   2 years ago Essential hypertension   Primary Care at [facility], Meda Coffee, MD

## 2021-02-08 ENCOUNTER — Other Ambulatory Visit: Payer: Self-pay | Admitting: Registered Nurse

## 2021-02-08 DIAGNOSIS — I10 Essential (primary) hypertension: Secondary | ICD-10-CM

## 2021-02-08 NOTE — Telephone Encounter (Signed)
Pt needs OV 

## 2021-03-03 ENCOUNTER — Other Ambulatory Visit: Payer: Self-pay | Admitting: Registered Nurse

## 2021-03-03 DIAGNOSIS — I10 Essential (primary) hypertension: Secondary | ICD-10-CM

## 2021-03-26 ENCOUNTER — Other Ambulatory Visit: Payer: Self-pay | Admitting: Registered Nurse

## 2021-03-26 DIAGNOSIS — I10 Essential (primary) hypertension: Secondary | ICD-10-CM

## 2021-04-13 ENCOUNTER — Other Ambulatory Visit: Payer: Self-pay | Admitting: Registered Nurse

## 2021-04-13 DIAGNOSIS — F17200 Nicotine dependence, unspecified, uncomplicated: Secondary | ICD-10-CM

## 2021-04-19 ENCOUNTER — Other Ambulatory Visit: Payer: Self-pay | Admitting: Registered Nurse

## 2021-04-19 DIAGNOSIS — I10 Essential (primary) hypertension: Secondary | ICD-10-CM

## 2021-04-25 ENCOUNTER — Other Ambulatory Visit: Payer: Self-pay | Admitting: Registered Nurse

## 2021-04-25 DIAGNOSIS — I10 Essential (primary) hypertension: Secondary | ICD-10-CM

## 2021-04-27 ENCOUNTER — Other Ambulatory Visit: Payer: Self-pay | Admitting: Registered Nurse

## 2021-04-27 DIAGNOSIS — I10 Essential (primary) hypertension: Secondary | ICD-10-CM

## 2021-05-10 ENCOUNTER — Other Ambulatory Visit: Payer: Self-pay | Admitting: Registered Nurse

## 2021-05-10 DIAGNOSIS — I10 Essential (primary) hypertension: Secondary | ICD-10-CM

## 2021-05-20 ENCOUNTER — Other Ambulatory Visit: Payer: Self-pay | Admitting: Registered Nurse

## 2021-05-20 DIAGNOSIS — I10 Essential (primary) hypertension: Secondary | ICD-10-CM

## 2021-05-28 ENCOUNTER — Other Ambulatory Visit: Payer: Self-pay | Admitting: Registered Nurse

## 2021-05-28 DIAGNOSIS — I10 Essential (primary) hypertension: Secondary | ICD-10-CM

## 2021-06-15 ENCOUNTER — Other Ambulatory Visit: Payer: Self-pay | Admitting: Registered Nurse

## 2021-06-15 DIAGNOSIS — I10 Essential (primary) hypertension: Secondary | ICD-10-CM

## 2021-08-17 ENCOUNTER — Telehealth (INDEPENDENT_AMBULATORY_CARE_PROVIDER_SITE_OTHER): Payer: 59 | Admitting: Nurse Practitioner

## 2021-08-17 ENCOUNTER — Encounter: Payer: Self-pay | Admitting: Nurse Practitioner

## 2021-08-17 DIAGNOSIS — I10 Essential (primary) hypertension: Secondary | ICD-10-CM | POA: Diagnosis not present

## 2021-08-17 DIAGNOSIS — F17200 Nicotine dependence, unspecified, uncomplicated: Secondary | ICD-10-CM

## 2021-08-17 MED ORDER — NICOTINE 14 MG/24HR TD PT24: 14.0000 mg | MEDICATED_PATCH | Freq: Every day | TRANSDERMAL | 1 refills | Status: DC

## 2021-08-17 MED ORDER — AMLODIPINE BESYLATE 10 MG PO TABS: 10.0000 mg | ORAL_TABLET | Freq: Every day | ORAL | 0 refills | Status: DC

## 2021-08-17 MED ORDER — LOSARTAN POTASSIUM 25 MG PO TABS: 25.0000 mg | ORAL_TABLET | Freq: Every day | ORAL | 0 refills | Status: DC

## 2021-08-17 NOTE — Progress Notes (Signed)
Virtual Visit via Video Note  I connected with Lauren Lester on 08/17/21 at  8:00 AM EST by a video enabled telemedicine application and verified that I am speaking with the correct person using two identifiers.  Location: Patient: home  Provider: office   I discussed the limitations of evaluation and management by telemedicine and the availability of in person appointments. The patient expressed understanding and agreed to proceed.  History of Present Illness:  Patient presents today for medication refill and to establish care with this clinic. Patient's previous PCP was Lezlie Lye. She states that her PCP has left the practice so she isswitching to this office.  Patient is currently on amlodipine and losartan for hypertension.  Patient is a smoker and was getting NicoDerm patches as well.  She states that she is current everyday smoker but would like to quit.  She states that she has been out of her hypertension medications for about a month.  She has been checking blood pressures at home and states that her blood pressures have been running around 149/93. Denies f/c/s, n/v/d, hemoptysis, PND, chest pain or edema.     Observations/Objective:  Vitals with BMI 05/27/2020 05/27/2020 05/13/2020  Height - - 5\' 6"   Weight - - 145 lbs  BMI - - 23.41  Systolic 159 160  Diastolic 89 91 83  Pulse - - 82      Assessment and Plan:  Hypertension:  Will refill Norvasc and cozaar  Stay active  Stay well hydrated  Smoking cessation:  Will reorder Nicoderm patches  Follow up:  Follow up in 1 month with Dr. [id number] or Amy    I discussed the assessment and treatment plan with the patient. The patient was provided an opportunity to ask questions and all were answered. The patient agreed with the plan and demonstrated an understanding of the instructions.   The patient was advised to call back or seek an in-person evaluation if the symptoms worsen or if the condition fails to  improve as anticipated.  I provided 23 minutes of non-face-to-face time during this encounter.   Andrey Campanile, NP

## 2021-08-17 NOTE — Patient Instructions (Addendum)
Hypertension:  Will refill Norvasc and cozaar  Stay active  Stay well hydrated  Smoking cessation:  Will reorder Nicoderm patches  Follow up:  Follow up in 1 month with Dr. Andrey Campanile or Amy

## 2021-09-11 ENCOUNTER — Other Ambulatory Visit: Payer: Self-pay | Admitting: Nurse Practitioner

## 2021-09-11 DIAGNOSIS — I10 Essential (primary) hypertension: Secondary | ICD-10-CM

## 2021-09-17 ENCOUNTER — Other Ambulatory Visit: Payer: Self-pay | Admitting: Nurse Practitioner

## 2021-09-17 DIAGNOSIS — I10 Essential (primary) hypertension: Secondary | ICD-10-CM

## 2021-09-17 MED ORDER — AMLODIPINE BESYLATE 10 MG PO TABS: 10.0000 mg | ORAL_TABLET | Freq: Every day | ORAL | 0 refills | Status: DC

## 2021-09-17 MED ORDER — LOSARTAN POTASSIUM 25 MG PO TABS: 25.0000 mg | ORAL_TABLET | Freq: Every day | ORAL | 0 refills | Status: DC

## 2021-09-18 ENCOUNTER — Ambulatory Visit: Payer: 59 | Admitting: Family Medicine

## 2021-09-25 ENCOUNTER — Encounter (INDEPENDENT_AMBULATORY_CARE_PROVIDER_SITE_OTHER): Payer: Self-pay

## 2021-09-25 ENCOUNTER — Other Ambulatory Visit: Payer: Self-pay

## 2021-09-25 ENCOUNTER — Ambulatory Visit (INDEPENDENT_AMBULATORY_CARE_PROVIDER_SITE_OTHER): Payer: 59 | Admitting: Family Medicine

## 2021-09-25 ENCOUNTER — Encounter: Payer: Self-pay | Admitting: Family Medicine

## 2021-09-25 VITALS — BP 133/93 | HR 113 | Temp 98.0°F | Resp 16 | Ht 66.0 in | Wt 146.8 lb

## 2021-09-25 DIAGNOSIS — Z1211 Encounter for screening for malignant neoplasm of colon: Secondary | ICD-10-CM

## 2021-09-25 DIAGNOSIS — I10 Essential (primary) hypertension: Secondary | ICD-10-CM

## 2021-09-25 DIAGNOSIS — Z1231 Encounter for screening mammogram for malignant neoplasm of breast: Secondary | ICD-10-CM

## 2021-09-25 DIAGNOSIS — Z23 Encounter for immunization: Secondary | ICD-10-CM

## 2021-09-25 MED ORDER — LOSARTAN POTASSIUM 50 MG PO TABS: 50.0000 mg | ORAL_TABLET | Freq: Every day | ORAL | 0 refills | Status: DC

## 2021-09-25 MED ORDER — AMLODIPINE BESYLATE 10 MG PO TABS: 10.0000 mg | ORAL_TABLET | Freq: Every day | ORAL | 0 refills | Status: DC

## 2021-09-25 NOTE — Progress Notes (Signed)
Established Patient Office Visit  Subjective:  Patient ID: Lauren Lester, female    DOB: 10-07-1960  Age: 61 y.o. MRN: [id number]  CC: No chief complaint on file.   HPI Lauren Lester presents for follow up of hypertension. Patient denies acute complaints or concerns.   Past Medical History:  Diagnosis Date   Carpal tunnel syndrome    HTN (hypertension)    Smoker    Substance abuse (HCC)     Past Surgical History:  Procedure Laterality Date   ABDOMINAL HYSTERECTOMY      Family History  Problem Relation Age of Onset   Hypertension Mother     Social History   Socioeconomic History   Marital status: Single    Spouse name: Not on file   Number of children: Not on file   Years of education: Not on file   Highest education level: Not on file  Occupational History   Not on file  Tobacco Use   Smoking status: Every Day    Packs/day: 1.00    Types: Cigarettes   Smokeless tobacco: Never  Vaping Use   Vaping Use: Some days  Substance and Sexual Activity   Alcohol use: Yes    Comment: occasionally   Drug use: Not Currently   Sexual activity: Yes  Other Topics Concern   Not on file  Social History Narrative   Marital status: married      Children: one child; no grandchild.      Lives: with husband.      Employment: Location manager.      Tobacco: 1 ppd      Alcohol: none; previous alcoholism; cessation since 2013.      Drugs:  none   Social Determinants of Corporate investment banker Strain: Not on file  Food Insecurity: Not on file  Transportation Needs: Not on file  Physical Activity: Not on file  Stress: Not on file  Social Connections: Not on file  Intimate Partner Violence: Not on file    ROS Review of Systems  All other systems reviewed and are negative.  Objective:   Today's Vitals: BP (!) 133/93    Pulse (!) 113    Temp 98 F (36.7 C) (Oral)    Resp 16    Ht 5\' 6"  (1.676 m)    Wt 146 lb 12.8 oz (66.6 kg)    SpO2 93%    BMI 23.69  kg/m   Physical Exam Vitals and nursing note reviewed.  Constitutional:      General: She is not in acute distress. Cardiovascular:     Rate and Rhythm: Normal rate and regular rhythm.  Pulmonary:     Effort: Pulmonary effort is normal.     Breath sounds: Normal breath sounds.  Abdominal:     Palpations: Abdomen is soft.     Tenderness: There is no abdominal tenderness.  Musculoskeletal:     Right lower leg: No edema.     Left lower leg: No edema.  Neurological:     General: No focal deficit present.     Mental Status: She is alert and oriented to person, place, and time.    Assessment & Plan:   1. Essential hypertension Reading slightly above goal. Will increase  losartan form 25 mg to 50 mg daily and monitor. Meds refilled.   - amLODipine (NORVASC) 10 MG tablet; Take 1 tablet (10 mg total) by mouth daily.  Dispense: 90 tablet; Refill: 0 - losartan (COZAAR) 50 MG tablet;  Take 1 tablet (50 mg total) by mouth daily.  Dispense: 90 tablet; Refill: 0  2. Screening for colon cancer  - Cologuard  3. Encounter for screening mammogram for malignant neoplasm of breast  - MM Digital Screening; Future  4. Need for prophylactic vaccination and inoculation against influenza  - Flu Vaccine QUAD 72mo+IM (Fluarix, Fluzone & Alfiuria Quad PF)    Outpatient Encounter Medications as of 09/25/2021  Medication Sig   losartan (COZAAR) 50 MG tablet Take 1 tablet (50 mg total) by mouth daily.   multivitamin-iron-minerals-folic acid (CENTRUM) chewable tablet Chew 1 tablet by mouth daily.   nicotine (NICODERM CQ - DOSED IN MG/24 HOURS) 14 mg/24hr patch Place 1 patch (14 mg total) onto the skin daily.   [DISCONTINUED] amLODipine (NORVASC) 10 MG tablet Take 1 tablet (10 mg total) by mouth daily.   [DISCONTINUED] losartan (COZAAR) 25 MG tablet Take 1 tablet (25 mg total) by mouth daily.   amLODipine (NORVASC) 10 MG tablet Take 1 tablet (10 mg total) by mouth daily.   [DISCONTINUED]  triamcinolone cream (KENALOG) 0.1 % Apply 1 application topically 2 (two) times daily.   No facility-administered encounter medications on file as of 09/25/2021.    Follow-up: Return in about 3 months (around 12/24/2021) for physical.   Tommie Raymond, MD

## 2021-10-17 LAB — COLOGUARD: COLOGUARD: POSITIVE — AB

## 2021-10-18 ENCOUNTER — Encounter: Payer: Self-pay | Admitting: Family Medicine

## 2021-10-19 ENCOUNTER — Other Ambulatory Visit: Payer: Self-pay | Admitting: Family Medicine

## 2021-10-19 DIAGNOSIS — R195 Other fecal abnormalities: Secondary | ICD-10-CM

## 2021-10-23 ENCOUNTER — Ambulatory Visit: Payer: 59

## 2021-12-22 ENCOUNTER — Other Ambulatory Visit: Payer: Self-pay | Admitting: *Deleted

## 2021-12-22 DIAGNOSIS — I10 Essential (primary) hypertension: Secondary | ICD-10-CM

## 2021-12-22 MED ORDER — LOSARTAN POTASSIUM 50 MG PO TABS: 50.0000 mg | ORAL_TABLET | Freq: Every day | ORAL | 0 refills | Status: DC

## 2021-12-25 ENCOUNTER — Encounter: Payer: 59 | Admitting: Family Medicine

## 2022-01-22 ENCOUNTER — Encounter: Payer: Self-pay | Admitting: Internal Medicine

## 2022-01-22 ENCOUNTER — Other Ambulatory Visit: Payer: Self-pay

## 2022-01-22 ENCOUNTER — Other Ambulatory Visit: Payer: Self-pay | Admitting: Family Medicine

## 2022-01-22 DIAGNOSIS — I10 Essential (primary) hypertension: Secondary | ICD-10-CM

## 2022-01-22 MED ORDER — AMLODIPINE BESYLATE 10 MG PO TABS: 10.0000 mg | ORAL_TABLET | Freq: Every day | ORAL | 0 refills | Status: DC

## 2022-03-19 ENCOUNTER — Encounter: Payer: 59 | Admitting: Internal Medicine

## 2022-03-21 ENCOUNTER — Other Ambulatory Visit: Payer: Self-pay | Admitting: Family Medicine

## 2022-03-21 DIAGNOSIS — I10 Essential (primary) hypertension: Secondary | ICD-10-CM

## 2022-04-02 ENCOUNTER — Encounter: Payer: 59 | Admitting: Internal Medicine

## 2022-04-20 ENCOUNTER — Other Ambulatory Visit: Payer: Self-pay | Admitting: Family Medicine

## 2022-04-20 DIAGNOSIS — I10 Essential (primary) hypertension: Secondary | ICD-10-CM

## 2022-04-20 NOTE — Telephone Encounter (Signed)
Last RF 01/22/22 and order ends 04/22/22 RF is due Message sent to pt to make appt via MyChart 30 day courtesy refill given Requested Prescriptions  Pending Prescriptions Disp Refills   amLODipine (NORVASC) 10 MG tablet [Pharmacy Med Name: AMLODIPINE BESYLATE 10 MG TAB] 90 tablet 0    Sig: TAKE 1 TABLET BY MOUTH EVERY DAY     Cardiovascular: Calcium Channel Blockers 2 Failed - 04/20/2022  9:11 AM      Failed - Last BP in normal range    BP Readings from Last 1 Encounters:  09/25/21 (!) 133/93         Failed - Last Heart Rate in normal range    Pulse Readings from Last 1 Encounters:  09/25/21 (!) 113         Failed - Valid encounter within last 6 months    Recent Outpatient Visits           6 months ago Essential hypertension   Primary [facility] at [facility], MD   8 months ago Essential hypertension   [facility] at [facility], Gildardo Pounds, NP   1 year ago Rash and nonspecific skin eruption   [facility], Gerlene Burdock, NP   2 years ago Essential hypertension   [facility] at [facility], Meda Coffee, MD   3 years ago Essential hypertension   Primary Care at [facility], Meda Coffee, MD

## 2022-05-21 ENCOUNTER — Other Ambulatory Visit: Payer: Self-pay | Admitting: Family Medicine

## 2022-05-21 DIAGNOSIS — I10 Essential (primary) hypertension: Secondary | ICD-10-CM

## 2022-06-29 ENCOUNTER — Other Ambulatory Visit: Payer: Self-pay | Admitting: Family Medicine

## 2022-06-29 DIAGNOSIS — I10 Essential (primary) hypertension: Secondary | ICD-10-CM

## 2023-05-02 ENCOUNTER — Telehealth: Payer: Self-pay | Admitting: Family Medicine

## 2023-05-02 ENCOUNTER — Ambulatory Visit: Payer: Self-pay

## 2023-05-02 NOTE — Telephone Encounter (Signed)
Called pt and left vm to call office back to schedule appt requested via MyChart. 

## 2023-05-02 NOTE — Telephone Encounter (Signed)
  Chief Complaint: frequency, urgency, feel going small amounts- not emptying completely  Symptoms: see above Frequency: since trip in July Pertinent Negatives: Patient denies fever, pain Disposition: [] ED /[] Urgent Care (no appt availability in office) / [x] Appointment(In office/virtual)/ []  [facility]/ [] Home Care/ [] Refused Recommended Disposition /[] [facility]/ []  Follow-up with PCP Additional Notes: Call to office- patient has been scheduled.    .Reason for Disposition  Urinating more frequently than usual (i.e., frequency)  Answer Assessment - Initial Assessment Questions 1. SYMPTOM: "What's the main symptom you're concerned about?" (e.g., frequency, incontinence)     Small amount, frequency, urgency 2. ONSET: "When did the  urinary symptoms  start?"     July 3. PAIN: "Is there any pain?" If Yes, ask: "How bad is it?" (Scale: 1-10; mild, moderate, severe)     no 4. CAUSE: "What do you think is causing the symptoms?"     unsure 5. OTHER SYMPTOMS: "Do you have any other symptoms?" (e.g., blood in urine, fever, flank pain, pain with urination)     no  Protocols used: Urinary Symptoms-A-AH

## 2023-05-02 NOTE — Telephone Encounter (Signed)
Patient called, left VM to return the call to the office to speak to the NT.   Summary: bump on her left arm that looks like a boil-unable to urinate like normal, just a drip.   Pt stated she has a bump on her left arm that looks like a boil. Mentioned has no appetite since July. Stated when urinating she is unable to urinate like normal, just a drip. Declined abdominal pain.  No appointments. Seeking clinical advice.

## 2023-05-03 ENCOUNTER — Ambulatory Visit (INDEPENDENT_AMBULATORY_CARE_PROVIDER_SITE_OTHER): Payer: Commercial Managed Care - HMO | Admitting: Family Medicine

## 2023-05-03 ENCOUNTER — Encounter: Payer: Self-pay | Admitting: Family Medicine

## 2023-05-03 VITALS — BP 150/98 | HR 71 | Temp 98.1°F | Resp 16 | Wt 146.0 lb

## 2023-05-03 DIAGNOSIS — L0292 Furuncle, unspecified: Secondary | ICD-10-CM | POA: Diagnosis not present

## 2023-05-03 DIAGNOSIS — R350 Frequency of micturition: Secondary | ICD-10-CM | POA: Diagnosis not present

## 2023-05-03 DIAGNOSIS — I10 Essential (primary) hypertension: Secondary | ICD-10-CM | POA: Diagnosis not present

## 2023-05-03 DIAGNOSIS — F17200 Nicotine dependence, unspecified, uncomplicated: Secondary | ICD-10-CM | POA: Diagnosis not present

## 2023-05-03 MED ORDER — CEPHALEXIN 500 MG PO CAPS: 500.0000 mg | ORAL_CAPSULE | Freq: Three times a day (TID) | ORAL | 0 refills | Status: DC

## 2023-05-03 MED ORDER — AMLODIPINE BESYLATE 10 MG PO TABS: 10.0000 mg | ORAL_TABLET | Freq: Every day | ORAL | 0 refills | Status: DC

## 2023-05-03 MED ORDER — LOSARTAN POTASSIUM 50 MG PO TABS: 50.0000 mg | ORAL_TABLET | Freq: Every day | ORAL | 0 refills | Status: DC

## 2023-05-03 NOTE — Progress Notes (Unsigned)
Patient said that she is unable to urinate ant times and is very concern. Patient said sometimes she urinates a lot   Patient has possible spider bite /or boil on left arm.

## 2023-05-04 ENCOUNTER — Encounter: Payer: Self-pay | Admitting: Family Medicine

## 2023-05-04 NOTE — Progress Notes (Unsigned)
Established Patient Office Visit  Subjective    Patient ID: Lauren Lester, female    DOB: Jan 10, 1961  Age: 62 y.o. MRN: [id number]  CC: No chief complaint on file.   HPI Lauren Lester presents for follow up of chronic med issues. Patient also reports urinary frequency and urgency.    Outpatient Encounter Medications as of 05/03/2023  Medication Sig   cephALEXin (KEFLEX) 500 MG capsule Take 1 capsule (500 mg total) by mouth 3 (three) times daily.   multivitamin-iron-minerals-folic acid (CENTRUM) chewable tablet Chew 1 tablet by mouth daily.   nicotine (NICODERM CQ - DOSED IN MG/24 HOURS) 14 mg/24hr patch Place 1 patch (14 mg total) onto the skin daily.   [DISCONTINUED] amLODipine (NORVASC) 10 MG tablet TAKE 1 TABLET BY MOUTH EVERY DAY   [DISCONTINUED] losartan (COZAAR) 50 MG tablet Take 1 tablet (50 mg total) by mouth daily.   amLODipine (NORVASC) 10 MG tablet Take 1 tablet (10 mg total) by mouth daily.   losartan (COZAAR) 50 MG tablet Take 1 tablet (50 mg total) by mouth daily.   No facility-administered encounter medications on file as of 05/03/2023.    Past Medical History:  Diagnosis Date   Carpal tunnel syndrome    HTN (hypertension)    Smoker    Substance abuse (HCC)     Past Surgical History:  Procedure Laterality Date   ABDOMINAL HYSTERECTOMY      Family History  Problem Relation Age of Onset   Hypertension Mother     Social History   Socioeconomic History   Marital status: Single    Spouse name: Not on file   Number of children: Not on file   Years of education: Not on file   Highest education level: Not on file  Occupational History   Not on file  Tobacco Use   Smoking status: Every Day    Current packs/day: 1.00    Types: Cigarettes   Smokeless tobacco: Never  Vaping Use   Vaping status: Some Days  Substance and Sexual Activity   Alcohol use: Yes    Comment: occasionally   Drug use: Not Currently   Sexual activity: Yes  Other Topics  Concern   Not on file  Social History Narrative   Marital status: married      Children: one child; no grandchild.      Lives: with husband.      Employment: Location manager.      Tobacco: 1 ppd      Alcohol: none; previous alcoholism; cessation since 2013.      Drugs:  none   Social Determinants of Corporate investment banker Strain: Not on file  Food Insecurity: Not on file  Transportation Needs: Not on file  Physical Activity: Not on file  Stress: Not on file  Social Connections: Not on file  Intimate Partner Violence: Not on file    Review of Systems  Genitourinary:  Positive for dysuria, frequency and urgency.  All other systems reviewed and are negative.       Objective    BP (!) 150/98   Pulse 71   Temp 98.1 F (36.7 C) (Oral)   Resp 16   Wt 146 lb (66.2 kg)   SpO2 95%   BMI 23.57 kg/m   Physical Exam Vitals and nursing note reviewed.  Constitutional:      General: She is not in acute distress. Cardiovascular:     Rate and Rhythm: Normal rate and regular rhythm.  Pulmonary:  Effort: Pulmonary effort is normal.     Breath sounds: Normal breath sounds.  Abdominal:     Palpations: Abdomen is soft.     Tenderness: There is no abdominal tenderness.  Musculoskeletal:     Right lower leg: No edema.     Left lower leg: No edema.  Skin:    Findings: Abscess present.  Neurological:     General: No focal deficit present.     Mental Status: She is alert and oriented to person, place, and time.     {Labs (Optional):23779}    Assessment & Plan:   1. Boil Keflex prescribed.   2. Essential hypertension Elevated readings. Meds refilled. Continue  - losartan (COZAAR) 50 MG tablet; Take 1 tablet (50 mg total) by mouth daily.  Dispense: 90 tablet; Refill: 0 - amLODipine (NORVASC) 10 MG tablet; Take 1 tablet (10 mg total) by mouth daily.  Dispense: 90 tablet; Refill: 0  3. Urinary frequency Keflex as above - POCT URINALYSIS DIP (CLINITEK)  4.  Tobacco use disorder Discussed reduction/cessation    No follow-ups on file.   Tommie Raymond, MD

## 2023-05-24 ENCOUNTER — Ambulatory Visit (INDEPENDENT_AMBULATORY_CARE_PROVIDER_SITE_OTHER): Payer: Commercial Managed Care - HMO | Admitting: Family Medicine

## 2023-05-24 ENCOUNTER — Other Ambulatory Visit: Payer: Self-pay | Admitting: Family Medicine

## 2023-05-24 ENCOUNTER — Encounter: Payer: Self-pay | Admitting: Family Medicine

## 2023-05-24 VITALS — BP 131/84 | HR 101 | Temp 98.3°F | Resp 18 | Ht 67.0 in | Wt 140.4 lb

## 2023-05-24 DIAGNOSIS — F17200 Nicotine dependence, unspecified, uncomplicated: Secondary | ICD-10-CM | POA: Diagnosis not present

## 2023-05-24 DIAGNOSIS — I10 Essential (primary) hypertension: Secondary | ICD-10-CM | POA: Diagnosis not present

## 2023-05-24 DIAGNOSIS — L989 Disorder of the skin and subcutaneous tissue, unspecified: Secondary | ICD-10-CM | POA: Diagnosis not present

## 2023-05-24 DIAGNOSIS — R300 Dysuria: Secondary | ICD-10-CM

## 2023-05-24 MED ORDER — LOSARTAN POTASSIUM 50 MG PO TABS: 50.0000 mg | ORAL_TABLET | Freq: Every day | ORAL | 0 refills | Status: DC

## 2023-05-24 MED ORDER — AMLODIPINE BESYLATE 10 MG PO TABS: 10.0000 mg | ORAL_TABLET | Freq: Every day | ORAL | 0 refills | Status: DC

## 2023-05-27 ENCOUNTER — Encounter: Payer: Self-pay | Admitting: Family Medicine

## 2023-05-27 NOTE — Progress Notes (Signed)
Established Patient Office Visit  Subjective    Patient ID: Lauren Lester, female    DOB: 1961/08/21  Age: 62 y.o. MRN: [id number]  CC:  Chief Complaint  Patient presents with   Follow-up    Follow up labs    HPI Lauren Lester presents for follow up of chronic med issues. Patient denies acute complaints or concerns.   Outpatient Encounter Medications as of 05/24/2023  Medication Sig   cephALEXin (KEFLEX) 500 MG capsule Take 1 capsule (500 mg total) by mouth 3 (three) times daily.   multivitamin-iron-minerals-folic acid (CENTRUM) chewable tablet Chew 1 tablet by mouth daily.   nicotine (NICODERM CQ - DOSED IN MG/24 HOURS) 14 mg/24hr patch Place 1 patch (14 mg total) onto the skin daily.   [DISCONTINUED] amLODipine (NORVASC) 10 MG tablet Take 1 tablet (10 mg total) by mouth daily.   [DISCONTINUED] losartan (COZAAR) 50 MG tablet Take 1 tablet (50 mg total) by mouth daily.   No facility-administered encounter medications on file as of 05/24/2023.    Past Medical History:  Diagnosis Date   Carpal tunnel syndrome    HTN (hypertension)    Smoker    Substance abuse (HCC)     Past Surgical History:  Procedure Laterality Date   ABDOMINAL HYSTERECTOMY      Family History  Problem Relation Age of Onset   Hypertension Mother     Social History   Socioeconomic History   Marital status: Single    Spouse name: Not on file   Number of children: Not on file   Years of education: Not on file   Highest education level: Not on file  Occupational History   Not on file  Tobacco Use   Smoking status: Every Day    Current packs/day: 1.00    Types: Cigarettes   Smokeless tobacco: Never  Vaping Use   Vaping status: Some Days  Substance and Sexual Activity   Alcohol use: Yes    Comment: occasionally   Drug use: Not Currently   Sexual activity: Yes  Other Topics Concern   Not on file  Social History Narrative   Marital status: married      Children: one child; no  grandchild.      Lives: with husband.      Employment: Location manager.      Tobacco: 1 ppd      Alcohol: none; previous alcoholism; cessation since 2013.      Drugs:  none   Social Determinants of Health   Financial Resource Strain: Medium Risk (05/24/2023)   Overall Financial Resource Strain (CARDIA)    Difficulty of Paying Living Expenses: Somewhat hard  Food Insecurity: No Food Insecurity (05/24/2023)   Hunger Vital Sign    Worried About Running Out of Food in the Last Year: Never true    Ran Out of Food in the Last Year: Never true  Transportation Needs: No Transportation Needs (05/24/2023)   PRAPARE - Administrator, Civil Service (Medical): No    Lack of Transportation (Non-Medical): No  Physical Activity: Sufficiently Active (05/24/2023)   Exercise Vital Sign    Days of Exercise per Week: 7 days    Minutes of Exercise per Session: 30 min  Stress: No Stress Concern Present (05/24/2023)   [employer] of Occupational Health - Occupational Stress Questionnaire    Feeling of Stress : Only a little  Social Connections: Moderately Isolated (05/24/2023)   Social Connection and Isolation Panel [NHANES]    Frequency  of Communication with Friends and Family: More than three times a week    Frequency of Social Gatherings with Friends and Family: More than three times a week    Attends Religious Services: More than 4 times per year    Active Member of [employer] or Organizations: No    Attends Banker Meetings: Never    Marital Status: Widowed  Intimate Partner Violence: Not At Risk (05/24/2023)   Humiliation, Afraid, Rape, and Kick questionnaire    Fear of Current or Ex-Partner: No    Emotionally Abused: No    Physically Abused: No    Sexually Abused: No    Review of Systems  All other systems reviewed and are negative.       Objective    BP 131/84 (BP Location: Right Arm, Patient Position: Sitting, Cuff Size: Normal)   Pulse (!) 101   Temp 98.3 F  (36.8 C) (Oral)   Resp 18   Ht 5\' 7"  (1.702 m)   Wt 140 lb 6.4 oz (63.7 kg)   SpO2 94%   BMI 21.99 kg/m   Physical Exam Vitals and nursing note reviewed.  Constitutional:      General: She is not in acute distress. Cardiovascular:     Rate and Rhythm: Normal rate and regular rhythm.  Pulmonary:     Effort: Pulmonary effort is normal.     Breath sounds: Normal breath sounds.  Abdominal:     Palpations: Abdomen is soft.     Tenderness: There is no abdominal tenderness.  Neurological:     General: No focal deficit present.     Mental Status: She is alert and oriented to person, place, and time.         Assessment & Plan:   1. Essential hypertension Appears stable. continue.  2. Skin lesion  - Ambulatory referral to General Surgery  3. Tobacco use disorder Discussed reduction/cesstion    Return in about 3 months (around 08/23/2023) for physical.   Tommie Raymond, MD

## 2023-08-17 ENCOUNTER — Other Ambulatory Visit: Payer: Self-pay | Admitting: Family Medicine

## 2023-08-17 DIAGNOSIS — I10 Essential (primary) hypertension: Secondary | ICD-10-CM

## 2023-08-23 ENCOUNTER — Telehealth: Payer: Self-pay | Admitting: Family Medicine

## 2023-08-23 ENCOUNTER — Encounter: Payer: 59 | Admitting: Family Medicine

## 2023-08-23 NOTE — Telephone Encounter (Signed)
Called patient to reschedule missed appointment no answer left voicemail

## 2023-11-16 ENCOUNTER — Other Ambulatory Visit: Payer: Self-pay | Admitting: Family Medicine

## 2023-11-16 DIAGNOSIS — I10 Essential (primary) hypertension: Secondary | ICD-10-CM

## 2023-11-16 NOTE — Telephone Encounter (Signed)
 Requested medication (s) are due for refill today:   Yes  Requested medication (s) are on the active medication list:   Yes  Future visit scheduled:   No     LOV 05/24/2023   Last ordered: 08/17/2023 #30, 2 refills  Unable to refill due to labs being expired.   Requested Prescriptions  Pending Prescriptions Disp Refills   losartan (COZAAR) 50 MG tablet [Pharmacy Med Name: LOSARTAN POTASSIUM 50 MG TAB] 30 tablet 2    Sig: TAKE 1 TABLET BY MOUTH EVERY DAY     Cardiovascular:  Angiotensin Receptor Blockers Failed - 11/16/2023  2:58 PM      Failed - Cr in normal range and within 180 days    Creatinine, Ser  Date Value Ref Range Status  07/13/2019 0.66 0.57 - 1.00 mg/dL Final         Failed - K in normal range and within 180 days    Potassium  Date Value Ref Range Status  07/13/2019 4.0 3.5 - 5.2 mmol/L Final         Passed - Patient is not pregnant      Passed - Last BP in normal range    BP Readings from Last 1 Encounters:  05/24/23 131/84         Passed - Valid encounter within last 6 months    Recent Outpatient Visits           5 months ago Essential hypertension   [facility], MD   6 months ago [facility], MD   2 years ago Essential hypertension    [facility] at [facility], MD   2 years ago Essential hypertension    [facility] at [facility], Gildardo Pounds, NP   3 years ago Rash and nonspecific skin eruption   [facility] at [facility], Gerlene Burdock, NP

## 2023-11-18 ENCOUNTER — Other Ambulatory Visit: Payer: Self-pay | Admitting: Family Medicine

## 2023-11-18 DIAGNOSIS — I10 Essential (primary) hypertension: Secondary | ICD-10-CM

## 2024-05-24 ENCOUNTER — Telehealth: Payer: Self-pay | Admitting: Family

## 2024-05-24 DIAGNOSIS — R600 Localized edema: Secondary | ICD-10-CM

## 2024-05-24 NOTE — Progress Notes (Signed)
 Virtual Visit Consent   Lauren Lester, you are scheduled for a virtual visit with a [facility] provider today. Just as with appointments in the office, your consent must be obtained to participate. Your consent will be active for this visit and any virtual visit you may have with one of our providers in the next 365 days. If you have a MyChart account, a copy of this consent can be sent to you electronically.  As this is a virtual visit, video technology does not allow for your provider to perform a traditional examination. This may limit your provider's ability to fully assess your condition. If your provider identifies any concerns that need to be evaluated in person or the need to arrange testing (such as labs, EKG, etc.), we will make arrangements to do so. Although advances in technology are sophisticated, we cannot ensure that it will always work on either your end or our end. If the connection with a video visit is poor, the visit may have to be switched to a telephone visit. With either a video or telephone visit, we are not always able to ensure that we have a secure connection.  By engaging in this virtual visit, you consent to the provision of healthcare and authorize for your insurance to be billed (if applicable) for the services provided during this visit. Depending on your insurance coverage, you may receive a charge related to this service.  I need to obtain your verbal consent now. Are you willing to proceed with your visit today? [name] has provided verbal consent on 05/24/2024 for a virtual visit (video or telephone). Bari Learn, FNP  Date: 05/24/2024 5:39 PM   Virtual Visit via Video Note   I, Bari Learn, connected with  Lauren Lester  ([id number], 15-Aug-1961) on 05/24/24 at  5:00 PM EDT by a video-enabled telemedicine application and verified that I am speaking with the correct person using two identifiers.  Location: Patient: Virtual Visit Location  Patient: Home Provider: Virtual Visit Location Provider: Home Office   I discussed the limitations of evaluation and management by telemedicine and the availability of in person appointments. The patient expressed understanding and agreed to proceed.    History of Present Illness: Lauren Lester is a 63 y.o. who identifies as a female who was assigned female at birth, and is being seen today for swelling in bilateral feet and ankles that started two weeks ago. She takes norvasc  10 mg daily, but has been on that for 3 years.   She reports the swelling resolves in AM and gets worse as she stands on them all day.   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   Herpes zoster 12/02/2012   Alcoholism in remission (HCC) 12/02/2012    Allergies: No Known Allergies Medications:  Current Outpatient Medications:    amLODipine  (NORVASC ) 10 MG tablet, TAKE 1 TABLET BY MOUTH EVERY DAY, Disp: 90 tablet, Rfl: 1   losartan  (COZAAR ) 50 MG tablet, TAKE 1 TABLET BY MOUTH EVERY DAY, Disp: 30 tablet, Rfl: 2   multivitamin-iron-minerals-folic acid (CENTRUM) chewable tablet, Chew 1 tablet by mouth daily., Disp: , Rfl:    nicotine  (NICODERM CQ  - DOSED IN MG/24 HOURS) 14 mg/24hr patch, Place 1 patch (14 mg total) onto the skin daily., Disp: 28 patch, Rfl: 1  Observations/Objective: Patient is well-developed, well-nourished in no acute distress.  Resting comfortably  at home.  Head is normocephalic, atraumatic.  No labored breathing.  Speech is clear and coherent with logical content.  Patient is alert and oriented at baseline.  Bilateral ankles with mild edema  Assessment and Plan: 1. Peripheral edema (Primary)  Start wearing compression hose daily Low salt diet Elevated feet If  symptoms do not improve in the next 2-4 weeks, advised to follow up with PCP about possible change in Norvasc     Follow Up Instructions: I discussed the assessment and treatment plan with the patient. The patient was provided an opportunity to ask questions and all were answered. The patient agreed with the plan and demonstrated an understanding of the instructions.  A copy of instructions were sent to the patient via MyChart unless otherwise noted below.     The patient was advised to call back or seek an in-person evaluation if the symptoms worsen or if the condition fails to improve as anticipated.    Bari Learn, FNP

## 2024-05-24 NOTE — Patient Instructions (Signed)
 Peripheral Edema  Peripheral edema is swelling that is caused by a buildup of fluid. Peripheral edema most often affects the lower legs, ankles, and feet. It can also develop in the arms, hands, and face. The area of the body that has peripheral edema will look swollen. It may also feel heavy or warm. Your clothes may start to feel tight. Pressing on the area may make a temporary dent in your skin (pitting edema). You may not be able to move your swollen arm or leg as much as usual. There are many causes of peripheral edema. It can happen because of a complication of other conditions such as heart failure, kidney disease, or a problem with your circulation. It also can be a side effect of certain medicines or happen because of an infection. It often happens to women during pregnancy. Sometimes, the cause is not known. Follow these instructions at home: Managing pain, stiffness, and swelling  Raise (elevate) your legs while you are sitting or lying down. Move around often to prevent stiffness and to reduce swelling. Do not sit or stand for long periods of time. Do not wear tight clothing. Do not wear garters on your upper legs. Exercise your legs to get your circulation going. This helps to move the fluid back into your blood vessels, and it may help the swelling go down. Wear compression stockings as told by your health care provider. These stockings help to prevent blood clots and reduce swelling in your legs. It is important that these are the correct size. These stockings should be prescribed by your doctor to prevent possible injuries. If elastic bandages or wraps are recommended, use them as told by your health care provider. Medicines Take over-the-counter and prescription medicines only as told by your health care provider. Your health care provider may prescribe medicine to help your body get rid of excess water (diuretic). Take this medicine if you are told to take it. General  instructions Eat a low-salt (low-sodium) diet as told by your health care provider. Sometimes, eating less salt may reduce swelling. Pay attention to any changes in your symptoms. Moisturize your skin daily to help prevent skin from cracking and draining. Keep all follow-up visits. This is important. Contact a health care provider if: You have a fever. You have swelling in only one leg. You have increased swelling, redness, or pain in one or both of your legs. You have drainage or sores at the area where you have edema. Get help right away if: You have edema that starts suddenly or is getting worse, especially if you are pregnant or have a medical condition. You develop shortness of breath, especially when you are lying down. You have pain in your chest or abdomen. You feel weak. You feel like you will faint. These symptoms may be an emergency. Get help right away. Call 911. Do not wait to see if the symptoms will go away. Do not drive yourself to the hospital. Summary Peripheral edema is swelling that is caused by a buildup of fluid. Peripheral edema most often affects the lower legs, ankles, and feet. Move around often to prevent stiffness and to reduce swelling. Do not sit or stand for long periods of time. Pay attention to any changes in your symptoms. Contact a health care provider if you have edema that starts suddenly or is getting worse, especially if you are pregnant or have a medical condition. Get help right away if you develop shortness of breath, especially when lying down.  This information is not intended to replace advice given to you by your health care provider. Make sure you discuss any questions you have with your health care provider. Document Revised: 04/27/2021 Document Reviewed: 04/27/2021 Elsevier Patient Education  2024 [employer].

## 2024-06-13 ENCOUNTER — Telehealth: Payer: Self-pay | Admitting: Family Medicine

## 2024-06-13 ENCOUNTER — Ambulatory Visit: Payer: Self-pay

## 2024-06-13 NOTE — Telephone Encounter (Signed)
 FYI Only or Action Required?: Action required by provider: request for appointment, medication refill request, and update on patient condition.  Patient was last seen in primary care on 05/24/2024 by Lavell Bari LABOR, FNP.  Called Nurse Triage reporting Edema.  Symptoms began several weeks ago.  Interventions attempted: Rest, hydration, or home remedies, Dietary changes, and Other: elevation, compression stockings.  Symptoms are: unchanged.  Triage Disposition: See Physician Within 24 Hours  Patient/caregiver understands and will follow disposition?: Unsure   Copied from CRM #[id number]. Topic: Clinical - Red Word Triage >> Jun 13, 2024  9:28 AM Russell PARAS wrote: Red Word that prompted transfer to Nurse Triage:   Pt has been having swelling in her legs for over 2 weeks Was advised Bari Lavell to contact clinic if continued  Pain in legs and feet Is keeping legs elevated and using compression stockings. Swelling is improved after elevation at night, but comes back after walking around  Pt of Dr. Raguel Blush Reason for Disposition  [1] MODERATE leg swelling (e.g., swelling extends up to knees) AND [2] new-onset or getting worse  Answer Assessment - Initial Assessment Questions Additional info: 1) Out of potassium for one month. [facility]ing [facility] She has been eating bananas to help with potassium. See refill encounter.   2) Follow up call to home phone [phone]    1. ONSET: When did the swelling start? (e.g., minutes, hours, days)     2 weeks ago 2. LOCATION: What part of the leg is swollen?  Are both legs swollen or just one leg?     Bilateral legs  3. SEVERITY: How bad is the swelling? (e.g., localized; mild, moderate, severe)     Ankles to knees  4. REDNESS: Is there redness or signs of infection?     Denies  5. PAIN: Is the swelling painful to touch? If Yes, ask: How painful is it?   (Scale 1-10; mild, moderate or severe)     Pain improves with  rest 6. FEVER: Do you have a fever? If Yes, ask: What is it, how was it measured, and when did it start?      Denies  7. CAUSE: What do you think is causing the leg swelling?     unsure 8. MEDICAL HISTORY: Do you have a history of blood clots (e.g., DVT), cancer, heart failure, kidney disease, or liver failure?     htn 9. RECURRENT SYMPTOM: Have you had leg swelling before? If Yes, ask: When was the last time? What happened that time?     Ongoing for two weeks 10. OTHER SYMPTOMS: Do you have any other symptoms? (e.g., chest pain, difficulty breathing)       Denies  11. PREGNANCY: Is there any chance you are pregnant? When was your last menstrual period?  Protocols used: Leg Swelling and Edema-A-AH

## 2024-06-13 NOTE — Telephone Encounter (Signed)
 Patient scheduled for appointment.

## 2024-06-26 ENCOUNTER — Ambulatory Visit: Payer: Self-pay | Admitting: Family Medicine

## 2024-06-26 ENCOUNTER — Encounter: Payer: Self-pay | Admitting: Family Medicine

## 2024-06-26 VITALS — BP 126/81 | HR 122 | Ht 67.0 in | Wt 133.0 lb

## 2024-06-26 DIAGNOSIS — M79604 Pain in right leg: Secondary | ICD-10-CM

## 2024-06-26 DIAGNOSIS — M7989 Other specified soft tissue disorders: Secondary | ICD-10-CM | POA: Diagnosis not present

## 2024-06-26 DIAGNOSIS — F17200 Nicotine dependence, unspecified, uncomplicated: Secondary | ICD-10-CM | POA: Diagnosis not present

## 2024-06-26 DIAGNOSIS — I10 Essential (primary) hypertension: Secondary | ICD-10-CM | POA: Diagnosis not present

## 2024-06-26 DIAGNOSIS — M79605 Pain in left leg: Secondary | ICD-10-CM

## 2024-06-26 MED ORDER — LOSARTAN POTASSIUM 50 MG PO TABS: 50.0000 mg | ORAL_TABLET | Freq: Every day | ORAL | 1 refills | Status: AC

## 2024-06-26 MED ORDER — FUROSEMIDE 20 MG PO TABS: 20.0000 mg | ORAL_TABLET | Freq: Every day | ORAL | 0 refills | Status: DC

## 2024-06-26 MED ORDER — NICOTINE 14 MG/24HR TD PT24: 14.0000 mg | MEDICATED_PATCH | Freq: Every day | TRANSDERMAL | 1 refills | Status: AC

## 2024-06-27 NOTE — Progress Notes (Signed)
 Established Patient Office Visit  Subjective    Patient ID: Lauren Lester, female    DOB: [birth year]  Age: 63 y.o. MRN: [id number]  CC:  Chief Complaint  Patient presents with   Leg Pain    Pt reports pain and swelling in both legs     HPI Lauren Lester presents with bilateral pain and swelling. Patient reports that sometimes the pain is increasing with activity. Patient is a smoker.   Outpatient Encounter Medications as of 06/26/2024  Medication Sig   furosemide (LASIX) 20 MG tablet Take 1 tablet (20 mg total) by mouth daily.   multivitamin-iron-minerals-folic acid (CENTRUM) chewable tablet Chew 1 tablet by mouth daily.   losartan  (COZAAR ) 50 MG tablet Take 1 tablet (50 mg total) by mouth daily.   nicotine  (NICODERM CQ  - DOSED IN MG/24 HOURS) 14 mg/24hr patch Place 1 patch (14 mg total) onto the skin daily.   [DISCONTINUED] amLODipine  (NORVASC ) 10 MG tablet TAKE 1 TABLET BY MOUTH EVERY DAY (Patient not taking: Reported on 06/26/2024)   [DISCONTINUED] losartan  (COZAAR ) 50 MG tablet TAKE 1 TABLET BY MOUTH EVERY DAY   [DISCONTINUED] nicotine  (NICODERM CQ  - DOSED IN MG/24 HOURS) 14 mg/24hr patch Place 1 patch (14 mg total) onto the skin daily.   No facility-administered encounter medications on file as of 06/26/2024.    Past Medical History:  Diagnosis Date   Carpal tunnel syndrome    HTN (hypertension)    Smoker    Substance abuse (HCC)     Past Surgical History:  Procedure Laterality Date   ABDOMINAL HYSTERECTOMY      Family History  Problem Relation Age of Onset   Hypertension Mother     Social History   Socioeconomic History   Marital status: Single    Spouse name: Not on file   Number of children: Not on file   Years of education: Not on file   Highest education level: Not on file  Occupational History   Not on file  Tobacco Use   Smoking status: Every Day    Current packs/day: 1.00    Types: Cigarettes   Smokeless tobacco: Never  Vaping Use    Vaping status: Some Days  Substance and Sexual Activity   Alcohol use: Yes    Comment: occasionally   Drug use: Not Currently   Sexual activity: Yes  Other Topics Concern   Not on file  Social History Narrative   Marital status: married      Children: one child; no grandchild.      Lives: with husband.      Employment: Location manager.      Tobacco: 1 ppd      Alcohol: none; previous alcoholism; cessation since 2013.      Drugs:  none   Social Drivers of Corporate investment banker Strain: Medium Risk (05/24/2023)   Overall Financial Resource Strain (CARDIA)    Difficulty of Paying Living Expenses: Somewhat hard  Food Insecurity: No Food Insecurity (05/24/2023)   Hunger Vital Sign    Worried About Running Out of Food in the Last Year: Never true    Ran Out of Food in the Last Year: Never true  Transportation Needs: No Transportation Needs (05/24/2023)   PRAPARE - Administrator, Civil Service (Medical): No    Lack of Transportation (Non-Medical): No  Physical Activity: Sufficiently Active (05/24/2023)   Exercise Vital Sign    Days of Exercise per Week: 7 days    Minutes  of Exercise per Session: 30 min  Stress: No Stress Concern Present (05/24/2023)   [employer] of Occupational Health - Occupational Stress Questionnaire    Feeling of Stress : Only a little  Social Connections: Moderately Isolated (05/24/2023)   Social Connection and Isolation Panel    Frequency of Communication with Friends and Family: More than three times a week    Frequency of Social Gatherings with Friends and Family: More than three times a week    Attends Religious Services: More than 4 times per year    Active Member of [employer] or Organizations: No    Attends Banker Meetings: Never    Marital Status: Widowed  Intimate Partner Violence: Not At Risk (05/24/2023)   Humiliation, Afraid, Rape, and Kick questionnaire    Fear of Current or Ex-Partner: No    Emotionally Abused:  No    Physically Abused: No    Sexually Abused: No    Review of Systems  All other systems reviewed and are negative.       Objective    BP 126/81   Pulse (!) 122   Ht 5' 7 (1.702 m)   Wt 133 lb (60.3 kg)   SpO2 (!) 82%   BMI 20.83 kg/m   Physical Exam Vitals and nursing note reviewed.  Constitutional:      General: She is not in acute distress. Cardiovascular:     Rate and Rhythm: Normal rate and regular rhythm.  Pulmonary:     Effort: Pulmonary effort is normal.     Breath sounds: Normal breath sounds.  Abdominal:     Palpations: Abdomen is soft.     Tenderness: There is no abdominal tenderness.  Musculoskeletal:        General: Tenderness (bilateral lower extremities.) present.     Right lower leg: Edema present.     Left lower leg: Edema present.  Neurological:     General: No focal deficit present.     Mental Status: She is alert and oriented to person, place, and time.         Assessment & Plan:   1. Essential hypertension (Primary) Appears stable. Continue  - losartan  (COZAAR ) 50 MG tablet; Take 1 tablet (50 mg total) by mouth daily.  Dispense: 90 tablet; Refill: 1  2. Bilateral leg pain ? Claudication. Referral for further eval/mgt - Ambulatory referral to Vascular Surgery  3. Leg swelling Lasix for 5 days - Ambulatory referral to Vascular Surgery  4. Tobacco use disorder Discussed reduction/cessation - nicotine  (NICODERM CQ  - DOSED IN MG/24 HOURS) 14 mg/24hr patch; Place 1 patch (14 mg total) onto the skin daily.  Dispense: 28 patch; Refill: 1  No follow-ups on file.   Lauren Lester SQUIBB, MD

## 2024-06-28 ENCOUNTER — Other Ambulatory Visit: Payer: Self-pay | Admitting: *Deleted

## 2024-06-28 DIAGNOSIS — M79606 Pain in leg, unspecified: Secondary | ICD-10-CM

## 2024-07-02 ENCOUNTER — Ambulatory Visit (HOSPITAL_COMMUNITY): Admitting: Surgery

## 2024-07-02 ENCOUNTER — Ambulatory Visit (HOSPITAL_COMMUNITY)

## 2024-07-18 ENCOUNTER — Other Ambulatory Visit: Payer: Self-pay | Admitting: Family Medicine

## 2024-07-19 NOTE — Telephone Encounter (Signed)
 Complete

## 2024-07-26 NOTE — Progress Notes (Signed)
 Patient ID: Lauren Lester, female   DOB: [birth year], 63 y.o.   MRN: [id number]  Reason for Consult: New Patient (Initial Visit)   Referred by Tanda Bleacher, MD  Subjective:     HPI Lauren Lester is a 63 y.o. female presenting for evaluation of leg pain. He reports that most of her pain is a tightness and achy feeling throughout the day.  She has swelling throughout the day from her knee down through the top of her foot.  She does have trouble walking but denies true vasculogenic claudication.  She does have some intermittent stinging pain in her legs at night but denies any pain in her feet or toes.  She has tried compression therapy for her swelling although had difficulty tolerating.  She is not on aspirin or lipid therapy. She is a current smoker.  Past Medical History:  Diagnosis Date   Carpal tunnel syndrome    HTN (hypertension)    Smoker    Substance abuse (HCC)    Family History  Problem Relation Age of Onset   Hypertension Mother    Past Surgical History:  Procedure Laterality Date   ABDOMINAL HYSTERECTOMY      Short Social History:  Social History   Tobacco Use   Smoking status: Every Day    Current packs/day: 1.00    Types: Cigarettes   Smokeless tobacco: Never  Substance Use Topics   Alcohol use: Yes    Comment: occasionally    No Known Allergies  Current Outpatient Medications  Medication Sig Dispense Refill   furosemide  (LASIX ) 20 MG tablet TAKE 1 TABLET BY MOUTH EVERY DAY 90 tablet 0   losartan  (COZAAR ) 50 MG tablet Take 1 tablet (50 mg total) by mouth daily. 90 tablet 1   multivitamin-iron-minerals-folic acid (CENTRUM) chewable tablet Chew 1 tablet by mouth daily.     nicotine  (NICODERM CQ  - DOSED IN MG/24 HOURS) 14 mg/24hr patch Place 1 patch (14 mg total) onto the skin daily. 28 patch 1   No current facility-administered medications for this visit.    REVIEW OF SYSTEMS  All other systems were reviewed and are negative      Objective:  Objective   Vitals:   07/27/24 0851  BP: 100/70  Pulse: (!) 118  Resp: 18  Temp: 97.8 F (36.6 C)  TempSrc: Temporal  SpO2: 99%  Weight: 126 lb 9.6 oz (57.4 kg)  Height: 5' 7 (1.702 m)   Body mass index is 19.83 kg/m.  Physical Exam General: no acute distress Cardiac: hemodynamically stable Abdomen: non-tender, no pulsatile mass Extremities: 1+ edema bilaterally, no varicosities no cyanosis or wounds Vascular:   Right: Palpable femoral, nonpalpable pedal's  Left: Palpable femoral, nonpalpable pedal's  Data: ABI +---------+------------------+-----+-----------+--------+  Right   Rt Pressure (mmHg)IndexWaveform   Comment   +---------+------------------+-----+-----------+--------+  Brachial 108                    triphasic            +---------+------------------+-----+-----------+--------+  PTA     85                0.79 multiphasic          +---------+------------------+-----+-----------+--------+  DP      90                0.83 multiphasic          +---------+------------------+-----+-----------+--------+  [place]  0.56                      +---------+------------------+-----+-----------+--------+   +---------+------------------+-----+-----------+-------+  Left    Lt Pressure (mmHg)IndexWaveform   Comment  +---------+------------------+-----+-----------+-------+  Brachial 94                     triphasic           +---------+------------------+-----+-----------+-------+  PTA     79                0.73 multiphasic         +---------+------------------+-----+-----------+-------+  DP      84                0.78 multiphasic         +---------+------------------+-----+-----------+-------+  Great Toe71                0.66                     +---------+------------------+-----+-----------+-------+      Assessment/Plan:   [name] is a 63 y.o. female with swelling and  PAD.  I explained that I think most of her symptoms are coming from her lower extremity swelling.  We discussed risk factors as well as natural history of PAD I explained that ABIs of 0.8 bilaterally represent mild disease and it does not seem that she has symptoms from her arterial issues.  We did discuss best medical management of aspirin, statin and smoking cessation.  I encouraged smoking cessation and she is in agreement to have an appointment with our pharmacist regarding lipid management. We also discussed management of compression pump elevation and exercise as she likely has a component of chronic venous insufficiency causing her swelling and lower extremity pain.  Plan for follow-up in 3 months with a repeat ABI and also a AAA screening  Recommendations to optimize cardiovascular risk: Abstinence from all tobacco products. Blood glucose control with goal A1c < 7%. Blood pressure control with goal blood pressure < 140/90 mmHg. Lipid reduction therapy with goal LDL-C <55 mg/dL  Aspirin 81mg  PO QD.  Atorvastatin 40-80mg  PO QD (or other high intensity statin therapy).   Norman GORMAN Serve MD [facility]

## 2024-07-27 ENCOUNTER — Encounter: Payer: Self-pay | Admitting: Vascular Surgery

## 2024-07-27 ENCOUNTER — Ambulatory Visit (HOSPITAL_COMMUNITY)
Admission: RE | Admit: 2024-07-27 | Discharge: 2024-07-27 | Disposition: A | Discharge status: Home / Self Care | Payer: Self-pay | Source: Ambulatory Visit | Attending: Surgery | Admitting: Surgery

## 2024-07-27 ENCOUNTER — Ambulatory Visit (INDEPENDENT_AMBULATORY_CARE_PROVIDER_SITE_OTHER): Payer: Self-pay | Admitting: Vascular Surgery

## 2024-07-27 VITALS — BP 100/70 | HR 118 | Temp 97.8°F | Resp 18 | Ht 67.0 in | Wt 126.6 lb

## 2024-07-27 DIAGNOSIS — M79606 Pain in leg, unspecified: Secondary | ICD-10-CM | POA: Insufficient documentation

## 2024-07-27 DIAGNOSIS — I872 Venous insufficiency (chronic) (peripheral): Secondary | ICD-10-CM

## 2024-07-27 DIAGNOSIS — I739 Peripheral vascular disease, unspecified: Secondary | ICD-10-CM

## 2024-07-27 LAB — VAS US ABI WITH/WO TBI
Left ABI: 0.78
Right ABI: 0.83

## 2024-07-30 ENCOUNTER — Other Ambulatory Visit: Payer: Self-pay | Admitting: *Deleted

## 2024-07-30 DIAGNOSIS — I739 Peripheral vascular disease, unspecified: Secondary | ICD-10-CM

## 2024-07-30 DIAGNOSIS — M79606 Pain in leg, unspecified: Secondary | ICD-10-CM

## 2024-07-30 DIAGNOSIS — Z136 Encounter for screening for cardiovascular disorders: Secondary | ICD-10-CM

## 2024-09-10 NOTE — Progress Notes (Signed)
 " [facility] Pharmacist Note  Name: Katelynn Heidler  MRN: [id number]  DOB: [birth year]  Sex: female PCP: Tanda Bleacher, MD CPP Referral Provider: Dr. Pearline  HISTORY OF PRESENT ILLNESS: Lauren Lester is a 64 y.o. female with PMH peripheral artery disease who presents for medication management for cardiovascular risk reduction.   Dyslipidemia/ASCVD  Current lipid-lowering medications: none  Previously tried medications/intolerances: none  Current antiplatelets/antithrombotics: none  Rx affordability and access: Uninsured   Tobacco Abuse Current medications: none  Rx affordability and access: Uninsured, previously had difficulty with cost of nicotine  patches    Tobacco Use History:   Age when started using tobacco on a daily basis: 64 yo  Number of cigarettes per day: 10 cigarettes per day   Smokes first cigarette 60 minutes after waking  Does not wake at night to smoke  Triggers to smoke: missing her daughter who moved to [place]  and her husband who passed away   Quit Attempt History:   Most recent quit attempt: 2017 Longest time ever been tobacco free: 1 month  Methods tried in the past: None  Rates importance of quitting tobacco on 1-10 scale of 10  Rates readiness of quitting tobacco on 1-10 scale of 5  Rates confidence of quitting tobacco on 1-10 scale of 10  Motivators to quit: her daughter Barriers to quit: life stress and triggers with missing loved ones   Past Medical History:  Diagnosis Date   Carpal tunnel syndrome    HTN (hypertension)    Smoker    Substance abuse (HCC)    Past Surgical History:  Procedure Laterality Date   ABDOMINAL HYSTERECTOMY     Family History  Problem Relation Age of Onset   Hypertension Mother    LABS: Lab Results  Component Value Date   CHOL 181 04/12/2019   HDL 52 04/12/2019   LDLCALC 98 04/12/2019   TRIG 153 (H) 04/12/2019   CHOLHDL 3.5 04/12/2019    Lab Results  Component Value Date   CREATININE 0.66 07/13/2019   BUN 9  07/13/2019   NA 140 07/13/2019   K 4.0 07/13/2019   CL 105 07/13/2019   CO2 21 07/13/2019   CrCl cannot be calculated (Patient's most recent lab result is older than the maximum 21 days allowed.).      Component Value Date/Time   PROT 7.2 04/12/2019 1650   ALBUMIN 4.7 04/12/2019 1650   AST 22 04/12/2019 1650   ALT 14 04/12/2019 1650   ALKPHOS 107 04/12/2019 1650   BILITOT 0.4 04/12/2019 1650    No results found for: HGBA1C  ASSESSMENT & PLAN:  Dyslipidemia Last lipid panel was in 2020, will check an updated CMP, lipid panel, and direct LDL today as she is not fasting. LDL goal <55 mg/dL for symptomatic PAD. Indicated for high intensity statin therapy. Start rosuvastatin  20 mg daily  Counseled patient on treatment, including efficacy, dosing, administration, possible adverse effects, and anticipated cost.  Reviewed long-term complications of uncontrolled cholesterol.  Reviewed goals for cholesterol readings with patient.  Reviewed dietary and lifestyle modifications to improve cholesterol.  Repeat lipid panel in 4-12 weeks.   Antiplatelets/Antithrombotics Patient with no recent revascularization. She has peripheral arterial disease. Start aspirin  81 mg daily  Counseled patient on treatment, including efficacy, dosing, administration, possible adverse effects, and anticipated cost.   Tobacco Abuse Patient not ready to quit today, but in contemplation stage and would like to eventually quit. She expressed that she would continue to work on reducing her number of cigarettes  per day and let me know when she is ready. Not currently interested in pharmacotherapy options.  Provided motivational interviewing to assess tobacco use and strategies for reduction.  Provided information on 1-800-QUIT NOW support program.   Patient reported she has never been screened for diabetes so will check an A1C today. She is not interested in vaccines. Will refer her to social work for assistance with  obtaining insurance.  Follow up: [facility] Clinic in ~2-3 months   Izetta Henry, PharmD [facility] [phone]  "

## 2024-09-11 ENCOUNTER — Encounter: Payer: Self-pay | Admitting: Pharmacist

## 2024-09-11 ENCOUNTER — Ambulatory Visit: Payer: Self-pay | Attending: Vascular Surgery | Admitting: Pharmacist

## 2024-09-11 ENCOUNTER — Telehealth (HOSPITAL_BASED_OUTPATIENT_CLINIC_OR_DEPARTMENT_OTHER): Payer: Self-pay | Admitting: Licensed Clinical Social Worker

## 2024-09-11 ENCOUNTER — Other Ambulatory Visit (HOSPITAL_COMMUNITY): Payer: Self-pay

## 2024-09-11 VITALS — BP 103/72 | HR 108

## 2024-09-11 DIAGNOSIS — I739 Peripheral vascular disease, unspecified: Secondary | ICD-10-CM

## 2024-09-11 MED ORDER — ROSUVASTATIN CALCIUM 20 MG PO TABS
20.0000 mg | ORAL_TABLET | Freq: Every day | ORAL | 3 refills | Status: AC
  Filled 2024-09-11: qty 90, 90d supply, fill #0

## 2024-09-11 MED ORDER — ASPIRIN 81 MG PO TBEC: 81.0000 mg | DELAYED_RELEASE_TABLET | Freq: Every day | ORAL | Status: AC

## 2024-09-11 NOTE — Telephone Encounter (Signed)
 H&V Care Navigation CSW Progress Note  Clinical Social Worker contacted patient by phone to f/u on referral for Medicaid as pt is self pay. No answer at [phone]. Left voicemail, will re-attempt again as able.  Patient is participating in a Managed Medicaid Plan:  No, self pay only  SDOH Screenings   Food Insecurity: No Food Insecurity (05/24/2023)  Housing: Low Risk (05/24/2023)  Transportation Needs: No Transportation Needs (05/24/2023)  Alcohol Screen: Low Risk (05/24/2023)  Depression (PHQ2-9): Low Risk (05/24/2023)  Financial Resource Strain: Medium Risk (05/24/2023)  Physical Activity: Sufficiently Active (05/24/2023)  Social Connections: Moderately Isolated (05/24/2023)  Stress: No Stress Concern Present (05/24/2023)  Tobacco Use: High Risk (09/11/2024)  Health Literacy: Adequate Health Literacy (05/24/2023)    Marit Lark, MSW, LCSW Clinical Social Worker II [facility] Heart/Vascular Care Navigation  [phone]- work cell phone (preferred)

## 2024-09-11 NOTE — Patient Instructions (Addendum)
-   Start taking rosuvastatin  20 mg (1 tablet) daily - Start taking aspirin  81 mg daily - Continue working on decreasing your number of cigarettes per day. Please reach out to me if you become reading to quit. - I will call you to go over your lab results - You have been referred to social work so they will call you to help with applying for insurance and access to care

## 2024-09-12 ENCOUNTER — Telehealth: Payer: Self-pay | Admitting: Licensed Clinical Social Worker

## 2024-09-12 NOTE — Telephone Encounter (Addendum)
 H&V Care Navigation CSW Progress Note  Clinical Social Worker contacted patient by phone to f/u on referral for Medicaid as pt is self pay. Again no answer today at [phone]. Left 2nd voicemail. Will mail information about Medicaid and how to apply in person. Will re-attempt pt one more time via telephone as able.  Patient is participating in a Managed Medicaid Plan:  NBo  SDOH Screenings   Food Insecurity: No Food Insecurity (05/24/2023)  Housing: Low Risk (05/24/2023)  Transportation Needs: No Transportation Needs (05/24/2023)  Alcohol Screen: Low Risk (05/24/2023)  Depression (PHQ2-9): Low Risk (05/24/2023)  Financial Resource Strain: Medium Risk (05/24/2023)  Physical Activity: Sufficiently Active (05/24/2023)  Social Connections: Moderately Isolated (05/24/2023)  Stress: No Stress Concern Present (05/24/2023)  Tobacco Use: High Risk (09/11/2024)  Health Literacy: Adequate Health Literacy (05/24/2023)    Marit Lark, MSW, LCSW Clinical Social Worker II [facility] Heart/Vascular Care Navigation  [phone]- work cell phone (preferred)

## 2024-09-17 ENCOUNTER — Other Ambulatory Visit (HOSPITAL_COMMUNITY): Payer: Self-pay

## 2024-09-18 ENCOUNTER — Ambulatory Visit: Payer: Self-pay | Admitting: Pharmacist

## 2024-09-18 DIAGNOSIS — I739 Peripheral vascular disease, unspecified: Secondary | ICD-10-CM

## 2024-09-18 LAB — COMPREHENSIVE METABOLIC PANEL WITH GFR
ALT: 32 IU/L (ref 0–32)
AST: 67 IU/L — ABNORMAL HIGH (ref 0–40)
Albumin: 2.7 g/dL — ABNORMAL LOW (ref 3.9–4.9)
Alkaline Phosphatase: 300 IU/L — ABNORMAL HIGH (ref 49–135)
BUN/Creatinine Ratio: 11 — ABNORMAL LOW (ref 12–28)
BUN: 7 mg/dL — ABNORMAL LOW (ref 8–27)
Bilirubin Total: 0.5 mg/dL (ref 0.0–1.2)
CO2: 22 mmol/L (ref 20–29)
Calcium: 8.6 mg/dL — ABNORMAL LOW (ref 8.7–10.3)
Chloride: 106 mmol/L (ref 96–106)
Creatinine, Ser: 0.63 mg/dL (ref 0.57–1.00)
Globulin, Total: 3.1 g/dL (ref 1.5–4.5)
Glucose: 121 mg/dL — ABNORMAL HIGH (ref 70–99)
Potassium: 4 mmol/L (ref 3.5–5.2)
Sodium: 142 mmol/L (ref 134–144)
Total Protein: 5.8 g/dL — ABNORMAL LOW (ref 6.0–8.5)
eGFR: 99 mL/min/1.73

## 2024-09-18 LAB — LDL CHOLESTEROL, DIRECT: LDL Direct: 50 mg/dL (ref 0–99)

## 2024-09-18 LAB — HEMOGLOBIN A1C
Est. average glucose Bld gHb Est-mCnc: 82 mg/dL
Hgb A1c MFr Bld: 4.5 % — ABNORMAL LOW (ref 4.8–5.6)

## 2024-09-20 ENCOUNTER — Telehealth (HOSPITAL_BASED_OUTPATIENT_CLINIC_OR_DEPARTMENT_OTHER): Payer: Self-pay | Admitting: Licensed Clinical Social Worker

## 2024-09-20 NOTE — Progress Notes (Unsigned)
 " Heart and Vascular Care Navigation  09/20/2024  Lauren Lester 21-Oct-1960 [id number]  Reason for Referral: self pay   Engaged with patient by telephone for initial visit for Heart and Vascular Care Coordination.                                                                                                   Assessment:                         LCSW was able to reach pt at [phone]. Introduced self, role, reason for call. Pt confirmed full name, DOB and address. Pt resides alone, currently not employed, receives widows social security benefits. Shares no hardships with transportation, housing or utilities. Discussed how to reach out to [employer] regarding eligibility for Medicare since receiving social security payments, and how to reach out to DSS regarding Medicaid eligibility. Shared I had mailed her information as well as sent a myChart with further instructions on how to complete this application. Pt agreeable to look for these resources and f/u as needed. I also will check in to see if anything has arrived/answer any questions.              HRT/VAS Care Coordination     Patients Home Cardiology Office [facility]   Outpatient Care Team Social Worker   Social Worker Name: Marit Lark, [place], [phone]   Living arrangements for the past 2 months Single Family Home   Lives with: Self   Patient Current Insurance Coverage Self-Pay   Patient Has Concern With Paying Medical Bills Yes   Patient Concerns With Medical Bills self pay, recieving widows SSA benefits   Medical Bill Referrals: Medicare eligibility from [facility] and [place]   Does Patient Have Prescription Coverage? No       Social History:                                                                             SDOH Screenings   Food Insecurity: No Food Insecurity (09/20/2024)  Housing: Low Risk (09/20/2024)  Transportation Needs: No Transportation Needs (09/20/2024)  Utilities: Not At Risk (09/20/2024)  Alcohol  Screen: Low Risk (05/24/2023)  Depression (PHQ2-9): Low Risk (05/24/2023)  Financial Resource Strain: Low Risk (09/20/2024)  Physical Activity: Sufficiently Active (05/24/2023)  Social Connections: Moderately Isolated (05/24/2023)  Stress: No Stress Concern Present (09/20/2024)  Tobacco Use: High Risk (09/11/2024)  Health Literacy: Adequate Health Literacy (09/20/2024)    SDOH Interventions: Financial Resources:  Surveyor, Quantity Strain Interventions: Artist, Other (Comment) DSS for financial assistance, Editor, Commissioning for [employer], and Social Security for [employer] eligibility  Food Insecurity:  Food Insecurity Interventions: Intervention Not Indicated  Housing Insecurity:  Housing Interventions: Intervention Not Indicated  Transportation:   Transportation Interventions: Intervention Not  Indicated     Other Care Navigation Interventions:     Provided Pharmacy assistance resources  Referral for Medicaid; if not eligible will send additional assistance  Patient expressed Mental Health concerns No.   Follow-up plan:   LCSW will f/u to ensure pt received resources/answer any questions.    "

## 2024-10-02 ENCOUNTER — Other Ambulatory Visit: Payer: Self-pay

## 2024-10-02 ENCOUNTER — Telehealth: Payer: Self-pay | Admitting: Licensed Clinical Social Worker

## 2024-10-02 DIAGNOSIS — I739 Peripheral vascular disease, unspecified: Secondary | ICD-10-CM

## 2024-10-02 DIAGNOSIS — Z136 Encounter for screening for cardiovascular disorders: Secondary | ICD-10-CM

## 2024-10-02 NOTE — Telephone Encounter (Signed)
 H&V Care Navigation CSW Progress Note  Clinical Social Worker contacted patient by phone to f/u on patient assistance paperwork. Reached pt at [phone]. Pt shoveling snow, shares she did receive it, will review it, no questions currently.  Patient is participating in a Managed Medicaid Plan:  No, self pay only  SDOH Screenings   Food Insecurity: No Food Insecurity (09/20/2024)  Housing: Low Risk (09/20/2024)  Transportation Needs: No Transportation Needs (09/20/2024)  Utilities: Not At Risk (09/20/2024)  Alcohol Screen: Low Risk (05/24/2023)  Depression (PHQ2-9): Low Risk (05/24/2023)  Financial Resource Strain: Low Risk (09/20/2024)  Physical Activity: Sufficiently Active (05/24/2023)  Social Connections: Moderately Isolated (05/24/2023)  Stress: No Stress Concern Present (09/20/2024)  Tobacco Use: High Risk (09/11/2024)  Health Literacy: Adequate Health Literacy (09/20/2024)    Marit Lark, MSW, LCSW Clinical Social Worker II [facility] Heart/Vascular Care Navigation  [phone]- work cell phone (preferred)

## 2024-10-10 ENCOUNTER — Telehealth: Payer: Self-pay | Admitting: Licensed Clinical Social Worker

## 2024-10-10 NOTE — Telephone Encounter (Signed)
 H&V Care Navigation CSW Progress Note  Clinical Social Worker contacted patient by phone to f/u on referral for Medicaid and information about how to apply. Reached pt again at [phone]. She shares she has been so focused on weather etc that she hasn't done more than glance at it. Shared we want to make sure she gets signed up to prevent ongoing costs of care from being on her plate. Encouraged her to call me as she reviews them/before upcoming appts if interested.  Patient is participating in a Managed Medicaid Plan:  No, self pay only  SDOH Screenings   Food Insecurity: No Food Insecurity (09/20/2024)  Housing: Low Risk (09/20/2024)  Transportation Needs: No Transportation Needs (09/20/2024)  Utilities: Not At Risk (09/20/2024)  Alcohol Screen: Low Risk (05/24/2023)  Depression (PHQ2-9): Low Risk (05/24/2023)  Financial Resource Strain: Low Risk (09/20/2024)  Physical Activity: Sufficiently Active (05/24/2023)  Social Connections: Moderately Isolated (05/24/2023)  Stress: No Stress Concern Present (09/20/2024)  Tobacco Use: High Risk (09/11/2024)  Health Literacy: Adequate Health Literacy (09/20/2024)    Marit Lark, MSW, LCSW Clinical Social Worker II [facility] Heart/Vascular Care Navigation  [phone]- work cell phone (preferred)

## 2024-10-26 ENCOUNTER — Ambulatory Visit: Payer: Self-pay

## 2024-10-26 ENCOUNTER — Ambulatory Visit (HOSPITAL_COMMUNITY): Payer: Self-pay

## 2024-10-31 ENCOUNTER — Ambulatory Visit: Payer: Self-pay | Admitting: Pharmacist

## 2024-11-22 ENCOUNTER — Ambulatory Visit: Payer: Self-pay | Admitting: Pharmacist
